# Patient Record
Sex: Female | Born: 1945 | Race: White | Hispanic: No | Marital: Married | Smoking: Current every day smoker
Health system: Southern US, Community
[De-identification: ages and names within clinical notes are randomized; demographics above are authoritative.]

## PROBLEM LIST (undated history)

## (undated) ENCOUNTER — Emergency Department (HOSPITAL_COMMUNITY): Discharge status: Absent without leave | Payer: Medicare Other

## (undated) DIAGNOSIS — F419 Anxiety disorder, unspecified: Secondary | ICD-10-CM

## (undated) DIAGNOSIS — M1990 Unspecified osteoarthritis, unspecified site: Secondary | ICD-10-CM

## (undated) DIAGNOSIS — I4891 Unspecified atrial fibrillation: Secondary | ICD-10-CM

## (undated) DIAGNOSIS — J189 Pneumonia, unspecified organism: Secondary | ICD-10-CM

## (undated) DIAGNOSIS — J449 Chronic obstructive pulmonary disease, unspecified: Secondary | ICD-10-CM

## (undated) DIAGNOSIS — F329 Major depressive disorder, single episode, unspecified: Secondary | ICD-10-CM

## (undated) DIAGNOSIS — R51 Headache: Secondary | ICD-10-CM

## (undated) DIAGNOSIS — R0602 Shortness of breath: Secondary | ICD-10-CM

## (undated) DIAGNOSIS — F1010 Alcohol abuse, uncomplicated: Secondary | ICD-10-CM

## (undated) DIAGNOSIS — F32A Depression, unspecified: Secondary | ICD-10-CM

## (undated) DIAGNOSIS — K219 Gastro-esophageal reflux disease without esophagitis: Secondary | ICD-10-CM

## (undated) HISTORY — PX: ABDOMINAL HYSTERECTOMY: SHX81

## (undated) HISTORY — PX: CHOLECYSTECTOMY: SHX55

## (undated) HISTORY — PX: OTHER SURGICAL HISTORY: SHX169

## (undated) HISTORY — PX: RECTOCELE REPAIR: SHX761

## (undated) HISTORY — PX: FRACTURE SURGERY: SHX138

---

## 1975-04-20 ENCOUNTER — Inpatient Hospital Stay (HOSPITAL_COMMUNITY): Payer: Self-pay

## 2012-10-18 ENCOUNTER — Inpatient Hospital Stay (HOSPITAL_COMMUNITY)
Admission: RE | Admit: 2012-10-18 | Discharge: 2012-10-23 | DRG: 193 | Disposition: A | Discharge status: Home Health | Payer: Medicare Other | Source: Other Acute Inpatient Hospital | Attending: Internal Medicine | Admitting: Internal Medicine

## 2012-10-18 DIAGNOSIS — I4891 Unspecified atrial fibrillation: Secondary | ICD-10-CM | POA: Diagnosis present

## 2012-10-18 DIAGNOSIS — J9620 Acute and chronic respiratory failure, unspecified whether with hypoxia or hypercapnia: Secondary | ICD-10-CM | POA: Diagnosis present

## 2012-10-18 DIAGNOSIS — I509 Heart failure, unspecified: Secondary | ICD-10-CM | POA: Diagnosis present

## 2012-10-18 DIAGNOSIS — Z9229 Personal history of other drug therapy: Secondary | ICD-10-CM

## 2012-10-18 DIAGNOSIS — I248 Other forms of acute ischemic heart disease: Secondary | ICD-10-CM | POA: Diagnosis present

## 2012-10-18 DIAGNOSIS — G9341 Metabolic encephalopathy: Secondary | ICD-10-CM | POA: Diagnosis present

## 2012-10-18 DIAGNOSIS — R918 Other nonspecific abnormal finding of lung field: Secondary | ICD-10-CM

## 2012-10-18 DIAGNOSIS — Z9981 Dependence on supplemental oxygen: Secondary | ICD-10-CM

## 2012-10-18 DIAGNOSIS — J189 Pneumonia, unspecified organism: Principal | ICD-10-CM | POA: Diagnosis present

## 2012-10-18 DIAGNOSIS — F10939 Alcohol use, unspecified with withdrawal, unspecified: Secondary | ICD-10-CM | POA: Diagnosis present

## 2012-10-18 DIAGNOSIS — N390 Urinary tract infection, site not specified: Secondary | ICD-10-CM | POA: Diagnosis present

## 2012-10-18 DIAGNOSIS — R222 Localized swelling, mass and lump, trunk: Secondary | ICD-10-CM | POA: Diagnosis present

## 2012-10-18 DIAGNOSIS — I5043 Acute on chronic combined systolic (congestive) and diastolic (congestive) heart failure: Secondary | ICD-10-CM | POA: Diagnosis present

## 2012-10-18 DIAGNOSIS — E876 Hypokalemia: Secondary | ICD-10-CM | POA: Diagnosis present

## 2012-10-18 DIAGNOSIS — E871 Hypo-osmolality and hyponatremia: Secondary | ICD-10-CM | POA: Diagnosis present

## 2012-10-18 DIAGNOSIS — D638 Anemia in other chronic diseases classified elsewhere: Secondary | ICD-10-CM | POA: Diagnosis present

## 2012-10-18 DIAGNOSIS — G9340 Encephalopathy, unspecified: Secondary | ICD-10-CM

## 2012-10-18 DIAGNOSIS — J449 Chronic obstructive pulmonary disease, unspecified: Secondary | ICD-10-CM

## 2012-10-18 DIAGNOSIS — F1010 Alcohol abuse, uncomplicated: Secondary | ICD-10-CM | POA: Diagnosis present

## 2012-10-18 DIAGNOSIS — I6529 Occlusion and stenosis of unspecified carotid artery: Secondary | ICD-10-CM | POA: Diagnosis present

## 2012-10-18 DIAGNOSIS — I6523 Occlusion and stenosis of bilateral carotid arteries: Secondary | ICD-10-CM

## 2012-10-18 DIAGNOSIS — F1020 Alcohol dependence, uncomplicated: Secondary | ICD-10-CM | POA: Diagnosis present

## 2012-10-18 DIAGNOSIS — R748 Abnormal levels of other serum enzymes: Secondary | ICD-10-CM | POA: Diagnosis present

## 2012-10-18 DIAGNOSIS — J441 Chronic obstructive pulmonary disease with (acute) exacerbation: Secondary | ICD-10-CM | POA: Diagnosis present

## 2012-10-18 DIAGNOSIS — F10239 Alcohol dependence with withdrawal, unspecified: Secondary | ICD-10-CM | POA: Diagnosis present

## 2012-10-18 DIAGNOSIS — J9601 Acute respiratory failure with hypoxia: Secondary | ICD-10-CM

## 2012-10-18 DIAGNOSIS — F17200 Nicotine dependence, unspecified, uncomplicated: Secondary | ICD-10-CM | POA: Diagnosis present

## 2012-10-18 DIAGNOSIS — H5316 Psychophysical visual disturbances: Secondary | ICD-10-CM | POA: Diagnosis present

## 2012-10-18 DIAGNOSIS — A498 Other bacterial infections of unspecified site: Secondary | ICD-10-CM | POA: Diagnosis present

## 2012-10-18 DIAGNOSIS — I2489 Other forms of acute ischemic heart disease: Secondary | ICD-10-CM | POA: Diagnosis present

## 2012-10-18 HISTORY — DX: Gastro-esophageal reflux disease without esophagitis: K21.9

## 2012-10-18 HISTORY — DX: Unspecified osteoarthritis, unspecified site: M19.90

## 2012-10-18 HISTORY — DX: Depression, unspecified: F32.A

## 2012-10-18 HISTORY — DX: Chronic obstructive pulmonary disease, unspecified: J44.9

## 2012-10-18 HISTORY — DX: Alcohol abuse, uncomplicated: F10.10

## 2012-10-18 HISTORY — DX: Unspecified atrial fibrillation: I48.91

## 2012-10-18 HISTORY — DX: Anxiety disorder, unspecified: F41.9

## 2012-10-18 HISTORY — DX: Headache: R51

## 2012-10-18 HISTORY — DX: Major depressive disorder, single episode, unspecified: F32.9

## 2012-10-18 HISTORY — DX: Shortness of breath: R06.02

## 2012-10-18 HISTORY — DX: Pneumonia, unspecified organism: J18.9

## 2012-10-18 LAB — CBC
HCT: 30.1 % — ABNORMAL LOW (ref 36.0–46.0)
MCH: 30.6 pg (ref 26.0–34.0)
MCV: 88.5 fL (ref 78.0–100.0)
Platelets: 294 10*3/uL (ref 150–400)
RBC: 3.4 MIL/uL — ABNORMAL LOW (ref 3.87–5.11)
WBC: 13.5 10*3/uL — ABNORMAL HIGH (ref 4.0–10.5)

## 2012-10-18 LAB — CREATININE, SERUM: GFR calc Af Amer: >90 mL/min (ref >90)

## 2012-10-18 MED ORDER — LORAZEPAM 1 MG PO TABS: 1.0000 mg | ORAL_TABLET | Freq: Four times a day (QID) | ORAL | Status: DC | PRN

## 2012-10-18 MED ORDER — SODIUM CHLORIDE 0.9 % IV SOLN
INTRAVENOUS | Status: AC
  Administered 2012-10-19: 01:00:00 via INTRAVENOUS

## 2012-10-18 MED ORDER — GUAIFENESIN ER 600 MG PO TB12
600.0000 mg | ORAL_TABLET | Freq: Two times a day (BID) | ORAL | Status: DC
  Administered 2012-10-19 (×2): 600 mg via ORAL
  Filled 2012-10-18 (×3): qty 1

## 2012-10-18 MED ORDER — FOLIC ACID 1 MG PO TABS
1.0000 mg | ORAL_TABLET | Freq: Every day | ORAL | Status: DC
  Administered 2012-10-19 – 2012-10-23 (×6): 1 mg via ORAL
  Filled 2012-10-18 (×6): qty 1

## 2012-10-18 MED ORDER — THIAMINE HCL 100 MG/ML IJ SOLN
100.0000 mg | Freq: Every day | INTRAMUSCULAR | Status: DC
  Filled 2012-10-18 (×6): qty 1

## 2012-10-18 MED ORDER — LEVOFLOXACIN IN D5W 750 MG/150ML IV SOLN
750.0000 mg | INTRAVENOUS | Status: DC
  Administered 2012-10-19: 01:00:00 750 mg via INTRAVENOUS
  Filled 2012-10-18 (×2): qty 150

## 2012-10-18 MED ORDER — ADULT MULTIVITAMIN W/MINERALS CH
1.0000 | ORAL_TABLET | Freq: Every day | ORAL | Status: DC
  Administered 2012-10-19 – 2012-10-23 (×5): 1 via ORAL
  Filled 2012-10-18 (×5): qty 1

## 2012-10-18 MED ORDER — DABIGATRAN ETEXILATE MESYLATE 150 MG PO CAPS
150.0000 mg | ORAL_CAPSULE | Freq: Two times a day (BID) | ORAL | Status: DC
  Administered 2012-10-19 (×3): 150 mg via ORAL
  Filled 2012-10-18 (×5): qty 1

## 2012-10-18 MED ORDER — SERTRALINE HCL 100 MG PO TABS
100.0000 mg | ORAL_TABLET | Freq: Every day | ORAL | Status: DC
  Administered 2012-10-19: 09:00:00 100 mg via ORAL
  Filled 2012-10-18: qty 1

## 2012-10-18 MED ORDER — VITAMIN B-1 100 MG PO TABS
100.0000 mg | ORAL_TABLET | Freq: Every day | ORAL | Status: DC
  Administered 2012-10-19 – 2012-10-23 (×6): 100 mg via ORAL
  Filled 2012-10-18 (×6): qty 1

## 2012-10-18 MED ORDER — METHYLPREDNISOLONE SODIUM SUCC 125 MG IJ SOLR
80.0000 mg | Freq: Two times a day (BID) | INTRAMUSCULAR | Status: DC
  Administered 2012-10-19 (×3): 80 mg via INTRAVENOUS
  Filled 2012-10-18 (×5): qty 1.28

## 2012-10-18 MED ORDER — ALBUTEROL SULFATE (5 MG/ML) 0.5% IN NEBU: 2.5000 mg | INHALATION_SOLUTION | RESPIRATORY_TRACT | Status: DC | PRN

## 2012-10-18 MED ORDER — LORAZEPAM 2 MG/ML IJ SOLN: 1.0000 mg | Freq: Four times a day (QID) | INTRAMUSCULAR | Status: DC | PRN

## 2012-10-18 NOTE — H&P (Signed)
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  Pt was hospitalized over 48 hours ago with ams, uti.  Was also dx with pna and new lung mass.  Thought to have gone through some etoh withdrawal.  No culture data with chart.  Could not find med rec sheet either.  Per records, pt has been on levaquin and urine cx grew out e coli, and possible blood cx with contaminate growth.  She has also been having some mild hyponatremia last Na level approx 127.  Cr function has been normal.  Pt denies any pain at this time.  Feels ok.  Has no complaints.  Says she is chronically on oxygen at home previous to admission.    Review of Systems:  Positive and negative as per HPI otherwise all other systems are negative  Past Medical History: As above   Medications: Prior to Admission medications   Medication Sig Start Date End Date Taking? Authorizing Provider  amiodarone (PACERONE) 200 MG tablet Take 200 mg by mouth 2 (two) times daily.   Yes Historical Provider, MD  dabigatran (PRADAXA) 150 MG CAPS capsule Take 150 mg by mouth every 12 (twelve) hours.   Yes Historical Provider, MD  diltiazem (TIAZAC) 240 MG 24 hr capsule Take 240 mg by mouth daily.   Yes Historical Provider, MD  HYDROcodone-acetaminophen (NORCO) 7.5-325 MG per tablet Take 1 tablet by mouth every 6 (six) hours as needed for pain.   Yes Historical Provider, MD  loratadine (CLARITIN) 10 MG tablet Take 10 mg by mouth daily.   Yes Historical Provider, MD  LORazepam (ATIVAN) 1 MG tablet Take 1 mg by mouth every 6 (six) hours as needed for anxiety.   Yes Historical Provider, MD  nicotine (NICODERM CQ - DOSED IN MG/24 HOURS) 21 mg/24hr patch Place 1 patch onto the skin daily.   Yes Historical Provider, MD  sertraline (ZOLOFT) 100 MG tablet Take 150 mg by mouth daily.   Yes Historical Provider, MD    Allergies:   Allergies   Allergen Reactions  . Other Anaphylaxis    Any "cillins"  . Penicillins Anaphylaxis    Social History: Lives at home with husband  Family History: None   Physical Exam: Filed Vitals:   10/18/12 2047  BP: 148/74  Pulse: 91  Temp: 97.6 F (36.4 C)  TempSrc: Oral  Resp: 18  Height: 5\' 6"  (1.676 m)  Weight: 54.8 kg (120 lb 13 oz)  SpO2: 98%   General appearance: alert, cooperative and no distress Head: Normocephalic, without obvious abnormality, atraumatic Eyes: negative Nose: Nares normal. Septum midline. Mucosa normal. No drainage or sinus tenderness. Neck: no JVD and supple, symmetrical, trachea midline Lungs: clear to auscultation bilaterally Heart: regular rate and rhythm, S1, S2 normal, no murmur, click, rub or gallop Abdomen: soft, non-tender; bowel sounds normal; no masses,  no organomegaly Extremities: extremities normal, atraumatic, no cyanosis or edema Pulses: 2+ and symmetric Skin: Skin color, texture, turgor normal. No rashes or lesions Neurologic: Grossly normal    Labs on Admission:  Reviewed. Mild low na level Normal cr Slightly elevated tsh Ct head neg Ct chest with lung mass   Radiological Exams on Admission: No results found.  Assessment/Plan  67 yo female with transfer from outside facility with dx uti, etoh withdrawal, pna, afib, new lung mass Principal Problem:   PNA (pneumonia) Active Problems:   ETOH abuse  UTI (urinary tract infection)   Afib   Lung mass   Alcohol withdrawal  Cont levaquin.  Repeat labs in am.  Place on ivf overnight.  Cont etoh ciwa protocol but should be out of window for withdrawal.  Clarify medications, have asked pharm to do this (including her hosp meds).  Nebs, steroids and oxygen for copd.  New lung mass needs w/u, did not see this was done at outside facililty per their records.  Ck free t4 level.  Tele.  Full code.  Keigan Girten A 10/18/2012, 10:38 PM

## 2012-10-18 NOTE — Plan of Care (Cosign Needed)
RN called- dtr of pt at bedside- requested pt have home meds og Neurontin and Mucinex reordered here- OK with Mucinex but since admitted with AMS will defer that med until pt can be re-examined in am by the rounding team.  Junious Silk, ANP

## 2012-10-19 ENCOUNTER — Inpatient Hospital Stay (HOSPITAL_COMMUNITY): Payer: Medicare Other

## 2012-10-19 ENCOUNTER — Encounter (HOSPITAL_COMMUNITY): Payer: Self-pay | Admitting: *Deleted

## 2012-10-19 DIAGNOSIS — G9340 Encephalopathy, unspecified: Secondary | ICD-10-CM

## 2012-10-19 DIAGNOSIS — I6529 Occlusion and stenosis of unspecified carotid artery: Secondary | ICD-10-CM

## 2012-10-19 DIAGNOSIS — J441 Chronic obstructive pulmonary disease with (acute) exacerbation: Secondary | ICD-10-CM

## 2012-10-19 DIAGNOSIS — R222 Localized swelling, mass and lump, trunk: Secondary | ICD-10-CM

## 2012-10-19 DIAGNOSIS — N390 Urinary tract infection, site not specified: Secondary | ICD-10-CM

## 2012-10-19 DIAGNOSIS — F10239 Alcohol dependence with withdrawal, unspecified: Secondary | ICD-10-CM

## 2012-10-19 DIAGNOSIS — I658 Occlusion and stenosis of other precerebral arteries: Secondary | ICD-10-CM

## 2012-10-19 DIAGNOSIS — F1010 Alcohol abuse, uncomplicated: Secondary | ICD-10-CM

## 2012-10-19 DIAGNOSIS — J189 Pneumonia, unspecified organism: Principal | ICD-10-CM

## 2012-10-19 DIAGNOSIS — J9601 Acute respiratory failure with hypoxia: Secondary | ICD-10-CM

## 2012-10-19 LAB — MRSA PCR SCREENING: MRSA by PCR: NEGATIVE

## 2012-10-19 LAB — BLOOD GAS, ARTERIAL
Acid-Base Excess: 3.6 mmol/L — ABNORMAL HIGH (ref 0.0–2.0)
Bicarbonate: 26.9 mEq/L — ABNORMAL HIGH (ref 20.0–24.0)
O2 Content: 5 L/min
O2 Saturation: 94.2 %
TCO2: 23.2 mmol/L (ref 0–100)
pCO2 arterial: 37.5 mmHg (ref 35.0–45.0)
pO2, Arterial: 73.3 mmHg — ABNORMAL LOW (ref 80.0–100.0)

## 2012-10-19 LAB — CBC WITH DIFFERENTIAL/PLATELET
Eosinophils Relative: 0 % (ref 0–5)
HCT: 29.3 % — ABNORMAL LOW (ref 36.0–46.0)
Hemoglobin: 10.2 g/dL — ABNORMAL LOW (ref 12.0–15.0)
Lymphocytes Relative: 3 % — ABNORMAL LOW (ref 12–46)
Lymphs Abs: 0.4 10*3/uL — ABNORMAL LOW (ref 0.7–4.0)
MCV: 89.3 fL (ref 78.0–100.0)
Monocytes Absolute: 0.4 10*3/uL (ref 0.1–1.0)
Monocytes Relative: 4 % (ref 3–12)
Neutro Abs: 11.2 10*3/uL — ABNORMAL HIGH (ref 1.7–7.7)
WBC: 12 10*3/uL — ABNORMAL HIGH (ref 4.0–10.5)

## 2012-10-19 LAB — BASIC METABOLIC PANEL
BUN: 11 mg/dL (ref 6–23)
CO2: 28 mEq/L (ref 19–32)
Calcium: 8.1 mg/dL — ABNORMAL LOW (ref 8.4–10.5)
Chloride: 100 mEq/L (ref 96–112)
Creatinine, Ser: 0.43 mg/dL — ABNORMAL LOW (ref 0.50–1.10)
Glucose, Bld: 158 mg/dL — ABNORMAL HIGH (ref 70–99)

## 2012-10-19 LAB — SEDIMENTATION RATE: Sed Rate: 70 mm/hr — ABNORMAL HIGH (ref 0–22)

## 2012-10-19 LAB — TROPONIN I: Troponin I: 0.86 ng/mL (ref <0.30)

## 2012-10-19 MED ORDER — NICOTINE 21 MG/24HR TD PT24
21.0000 mg | MEDICATED_PATCH | TRANSDERMAL | Status: DC
  Administered 2012-10-19 – 2012-10-23 (×5): 21 mg via TRANSDERMAL
  Filled 2012-10-19 (×5): qty 1

## 2012-10-19 MED ORDER — ALBUTEROL SULFATE (5 MG/ML) 0.5% IN NEBU
2.5000 mg | INHALATION_SOLUTION | Freq: Four times a day (QID) | RESPIRATORY_TRACT | Status: DC
  Administered 2012-10-19 (×2): 2.5 mg via RESPIRATORY_TRACT
  Filled 2012-10-19: qty 0.5

## 2012-10-19 MED ORDER — ENSURE COMPLETE PO LIQD
237.0000 mL | Freq: Two times a day (BID) | ORAL | Status: DC
  Administered 2012-10-19 – 2012-10-23 (×8): 237 mL via ORAL

## 2012-10-19 MED ORDER — LORAZEPAM 2 MG/ML IJ SOLN: 1.0000 mg | INTRAMUSCULAR | Status: DC | PRN

## 2012-10-19 MED ORDER — LORATADINE 10 MG PO TABS
10.0000 mg | ORAL_TABLET | Freq: Every day | ORAL | Status: DC
  Administered 2012-10-19: 13:00:00 10 mg via ORAL
  Filled 2012-10-19: qty 1

## 2012-10-19 MED ORDER — ALBUTEROL SULFATE (5 MG/ML) 0.5% IN NEBU
INHALATION_SOLUTION | RESPIRATORY_TRACT | Status: AC
  Administered 2012-10-19: 2.5 mg via RESPIRATORY_TRACT
  Filled 2012-10-19: qty 0.5

## 2012-10-19 MED ORDER — PNEUMOCOCCAL VAC POLYVALENT 25 MCG/0.5ML IJ INJ
0.5000 mL | INJECTION | INTRAMUSCULAR | Status: AC
  Administered 2012-10-20: 0.5 mL via INTRAMUSCULAR
  Filled 2012-10-19 (×2): qty 0.5

## 2012-10-19 MED ORDER — FUROSEMIDE 10 MG/ML IJ SOLN
40.0000 mg | Freq: Two times a day (BID) | INTRAMUSCULAR | Status: DC
  Administered 2012-10-19 – 2012-10-20 (×3): 40 mg via INTRAVENOUS
  Filled 2012-10-19 (×4): qty 4

## 2012-10-19 MED ORDER — BIOTENE DRY MOUTH MT LIQD
15.0000 mL | Freq: Two times a day (BID) | OROMUCOSAL | Status: DC
  Administered 2012-10-19 – 2012-10-23 (×9): 15 mL via OROMUCOSAL

## 2012-10-19 MED ORDER — IPRATROPIUM BROMIDE 0.02 % IN SOLN
0.5000 mg | RESPIRATORY_TRACT | Status: DC
  Administered 2012-10-19 – 2012-10-20 (×4): 0.5 mg via RESPIRATORY_TRACT
  Filled 2012-10-19 (×4): qty 2.5

## 2012-10-19 MED ORDER — ALBUTEROL SULFATE (5 MG/ML) 0.5% IN NEBU
2.5000 mg | INHALATION_SOLUTION | RESPIRATORY_TRACT | Status: DC
  Administered 2012-10-19 – 2012-10-20 (×4): 2.5 mg via RESPIRATORY_TRACT
  Filled 2012-10-19 (×4): qty 0.5

## 2012-10-19 MED ORDER — DEXMEDETOMIDINE HCL IN NACL 200 MCG/50ML IV SOLN
0.2000 ug/kg/h | INTRAVENOUS | Status: DC
  Administered 2012-10-19: 0.2 ug/kg/h via INTRAVENOUS
  Filled 2012-10-19: qty 50

## 2012-10-19 MED ORDER — SERTRALINE HCL 50 MG PO TABS
150.0000 mg | ORAL_TABLET | Freq: Every day | ORAL | Status: DC
  Administered 2012-10-19: 50 mg via ORAL
  Administered 2012-10-20 – 2012-10-23 (×4): 150 mg via ORAL
  Filled 2012-10-19 (×5): qty 1

## 2012-10-19 MED ORDER — HYDROCODONE-ACETAMINOPHEN 5-325 MG PO TABS
1.0000 | ORAL_TABLET | Freq: Once | ORAL | Status: AC
  Administered 2012-10-19: 09:00:00 1 via ORAL
  Filled 2012-10-19: qty 1

## 2012-10-19 MED ORDER — IPRATROPIUM BROMIDE 0.02 % IN SOLN
0.5000 mg | Freq: Four times a day (QID) | RESPIRATORY_TRACT | Status: DC
  Administered 2012-10-19 (×2): 0.5 mg via RESPIRATORY_TRACT
  Filled 2012-10-19: qty 2.5

## 2012-10-19 MED ORDER — CIPROFLOXACIN IN D5W 400 MG/200ML IV SOLN
400.0000 mg | Freq: Two times a day (BID) | INTRAVENOUS | Status: DC
  Administered 2012-10-19: 400 mg via INTRAVENOUS
  Filled 2012-10-19 (×2): qty 200

## 2012-10-19 MED ORDER — IPRATROPIUM BROMIDE 0.02 % IN SOLN
RESPIRATORY_TRACT | Status: AC
  Administered 2012-10-19: 0.5 mg via RESPIRATORY_TRACT
  Filled 2012-10-19: qty 2.5

## 2012-10-19 MED ORDER — TRAMADOL HCL 50 MG PO TABS
50.0000 mg | ORAL_TABLET | Freq: Four times a day (QID) | ORAL | Status: DC | PRN
  Administered 2012-10-19: 50 mg via ORAL
  Filled 2012-10-19: qty 1

## 2012-10-19 MED ORDER — INFLUENZA VAC SPLIT QUAD 0.5 ML IM SUSP
0.5000 mL | INTRAMUSCULAR | Status: AC
  Administered 2012-10-19: 09:00:00 0.5 mL via INTRAMUSCULAR
  Filled 2012-10-19: qty 0.5

## 2012-10-19 MED ORDER — AMIODARONE HCL 200 MG PO TABS
200.0000 mg | ORAL_TABLET | Freq: Two times a day (BID) | ORAL | Status: DC
  Administered 2012-10-19 – 2012-10-20 (×4): 200 mg via ORAL
  Filled 2012-10-19 (×6): qty 1

## 2012-10-19 MED ORDER — INFLUENZA VAC SPLIT QUAD 0.5 ML IM SUSP: 0.5000 mL | INTRAMUSCULAR | Status: DC

## 2012-10-19 MED ORDER — DILTIAZEM HCL ER BEADS 240 MG PO CP24
240.0000 mg | ORAL_CAPSULE | Freq: Every day | ORAL | Status: DC
  Administered 2012-10-19 – 2012-10-23 (×5): 240 mg via ORAL
  Filled 2012-10-19 (×5): qty 1

## 2012-10-19 MED ORDER — HYDROCODONE-ACETAMINOPHEN 7.5-325 MG PO TABS: 1.0000 | ORAL_TABLET | Freq: Four times a day (QID) | ORAL | Status: DC | PRN

## 2012-10-19 MED ORDER — DEXMEDETOMIDINE HCL IN NACL 200 MCG/50ML IV SOLN
0.2000 ug/kg/h | INTRAVENOUS | Status: DC
  Administered 2012-10-20: 0.3 ug/kg/h via INTRAVENOUS
  Filled 2012-10-19 (×2): qty 50

## 2012-10-19 NOTE — Progress Notes (Signed)
Pt received to room 1425 from [facility], [place] via ambulance. Pt on 5 liters of O2. Somewhat short of breath. Assisted to use bedpan and voided 300cc's.  Bed alarm on. Will notify md on call for orders.

## 2012-10-19 NOTE — Progress Notes (Signed)
PT Cancellation Note  Patient Details Name: Alexis Cuevas MRN: [id number] DOB: [birth year]   Cancelled Treatment:    Reason Eval/Treat Not Completed: Medical issues which prohibited therapy (attempted earleier, in pain. will return 9/13 AM)   Rada Hay 10/19/2012, 3:34 PM

## 2012-10-19 NOTE — Progress Notes (Signed)
INITIAL NUTRITION ASSESSMENT  DOCUMENTATION CODES Per approved criteria  -Not Applicable   INTERVENTION: 1. Ensure Complete po BID, each supplement provides 350 kcal and 13 grams of protein. 2. Magic cup BID between meals, each supplement provides 290 kcal and 9 grams of protein.  NUTRITION DIAGNOSIS: Inadequate oral intake related to pneumonia and lung mass as evidenced by reported intake less than estimated needs.   Goal: Pt to meet >/= 90% of their estimated nutrition needs   Monitor:  Weight, height, po intake  Reason for Assessment: MST  67 y.o. female  Admitting Dx: Acute encephalopathy  ASSESSMENT: Pt transferred from outside facility. 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 Per daughter, who is an employee at [employer], pt may have lost weight (about 7 lbs) recently. Daughter reports that pt's usual body weight is about 113 lbs. Pt's current body weight is 120 lbs. Per RN, pt has only had about 20-25% meal completion and that her diet has been changed to regular from heart healthy to try and increase po intake.   Height: Ht Readings from Last 1 Encounters:  10/18/12 5\' 6"  (1.676 m)    Weight: Wt Readings from Last 1 Encounters:  10/18/12 120 lb 13 oz (54.8 kg)    Ideal Body Weight: 59.3 kg  % Ideal Body Weight: 92%   Wt Readings from Last 10 Encounters:  10/18/12 120 lb 13 oz (54.8 kg)    Usual Body Weight: 113 lbs  % Usual Body Weight: 106%  BMI:  Body mass index is 19.51 kg/(m^2).  Estimated Nutritional Needs: Kcal: 1400-1550 Protein: 55-65 g Fluid: >1.6 L  Skin: WNL  Diet Order: General  EDUCATION NEEDS: -Education not appropriate at this time   Intake/Output Summary (Last 24 hours) at 10/19/12 1324 Last data filed at 10/19/12 1100  Gross per 24 hour  Intake    120 ml  Output    375 ml  Net   -255 ml    Last BM: none recorded   Labs:   Recent Labs Lab 10/18/12 2217 10/19/12 0400  NA  --  135   K  --  3.8  CL  --  100  CO2  --  28  BUN  --  11  CREATININE 0.44* 0.43*  CALCIUM  --  8.1*  GLUCOSE  --  158*    CBG (last 3)  No results found for this basename: GLUCAP,  in the last 72 hours  Scheduled Meds: . albuterol  2.5 mg Nebulization Q6H  . amiodarone  200 mg Oral BID  . antiseptic oral rinse  15 mL Mouth Rinse BID  . dabigatran  150 mg Oral Q12H  . diltiazem  240 mg Oral Daily  . folic acid  1 mg Oral Daily  . guaiFENesin  600 mg Oral BID  . ipratropium  0.5 mg Nebulization Q6H  . levofloxacin (LEVAQUIN) IV  750 mg Intravenous Q24H  . loratadine  10 mg Oral Daily  . methylPREDNISolone (SOLU-MEDROL) injection  80 mg Intravenous Q12H  . multivitamin with minerals  1 tablet Oral Daily  . nicotine  21 mg Transdermal Q24H  . [START ON 10/20/2012] pneumococcal 23 valent vaccine  0.5 mL Intramuscular Tomorrow-1000  . sertraline  150 mg Oral Daily  . thiamine  100 mg Oral Daily   Or  . thiamine  100 mg Intravenous Daily    Continuous Infusions:   Past Medical History  Diagnosis Date  . COPD (chronic obstructive  pulmonary disease)   . Depression   . Anxiety   . Shortness of breath   . Pneumonia   . Arthritis   . GERD (gastroesophageal reflux disease)   . Headache(784.0)   . Afib   . ETOH abuse     Past Surgical History  Procedure Laterality Date  . Abdominal hysterectomy    . Cholecystectomy    . Rectocele repair    . Jaw surgery with pins    . Fracture surgery      right arm    Ebbie Latus RD, LDN

## 2012-10-19 NOTE — Progress Notes (Signed)
[facility] PROGRESS NOTE  Alexis Cuevas ZOX:[id number] DOB: [birth year] DOA: 10/18/2012 PCP: No primary provider on file.  Assessment/Plan: Acute Encephalopathy -Presumed metabolic from PNA/UTI, altho questionable if she may have brain mets (unable to do MRI as she has metal in her jaw).  -CT Head from outside hospital was read as negative. -Seems to be improving: she is oriented, but still has visual hallucinations. Today she kept seeing "tiny people" in her room, she was also making movements with her hands and when I asked her what she was doing she said "I am planting flowers".  UTI -Reported cultures from outside hospital with pansensitive E coli. -Continue levaquin for 5 days.  PNA -Seen on CT chest at outside facility. -Continue levaquin. -No reason to believe this is aspiration PNA.  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 -Reported to be in the right hilum. -She has multiple pulmonary nodules. -Highly suspicious for malignancy in this smoker. -Have discussed with Pulmonary, who will see in consult for possible biopsy by bronchoscopy. -Patient and family interested in pursuing diagnosis and treatment.  COPD with Acute Exacerbation -She is definitely "tight" on exam today. -Continue IV steroids, will scheduled nebs as well as PRN. -Is on levaquin. -She is chronically oxygen dependant.   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 -Decision was made to treat her medically with Pradaxa. -Pradaxa may need to be held in anticipation of potential bronch with biopsy (pulmonary please advise!). Already received her am dose.  ETOH Abuse -Continue thiamine. -Do not believe she is at risk for withdrawals at this point as she has been hospitalized for 5 days now.  Tobacco Abuse -Nicotine patch. -Counseled on cessation.  DVT prophylaxis -On pradaxa.  Code Status: Full code Family Communication: Daughter Inetta Fermo at bedside, updated on plan of care.   Disposition Plan: Home when medically stable.   Consultants:  Pulmonary   Antibiotics:  Levaquin   Subjective: "I am having a rough time today with my breathing".  Objective: Filed Vitals:   10/18/12 2047 10/19/12 0200 10/19/12 0422  BP: 148/74  142/87  Pulse: 91 92 87  Temp: 97.6 F (36.4 C)  97.6 F (36.4 C)  TempSrc: Oral  Oral  Resp: 18  18  Height: 5\' 6"  (1.676 m)    Weight: 54.8 kg (120 lb 13 oz)    SpO2: 98%  95%    Intake/Output Summary (Last 24 hours) at 10/19/12 1020 Last data filed at 10/19/12 0838  Gross per 24 hour  Intake      0 ml  Output    375 ml  Net   -375 ml   Filed Weights   10/18/12 2047  Weight: 54.8 kg (120 lb 13 oz)    Exam:   General:  AA Ox3, but still with some visual hallucinations.  Cardiovascular: RRR, no M/R/G  Respiratory: Diffuse wheezing and ronchi, fair air movement.  Abdomen: S/NT/ND/+BS  Extremities: no C/C/E   Neurologic:  Non-focal  Data Reviewed: Basic Metabolic Panel:  Recent Labs Lab 10/18/12 2217 10/19/12 0400  NA  --  135  K  --  3.8  CL  --  100  CO2  --  28  GLUCOSE  --  158*  BUN  --  11  CREATININE 0.44* 0.43*  CALCIUM  --  8.1*   Liver Function Tests: No results found for this basename: AST, ALT, ALKPHOS, BILITOT, PROT, ALBUMIN,  in the last 168 hours No results found for this basename: LIPASE, AMYLASE,  in  the last 168 hours No results found for this basename: AMMONIA,  in the last 168 hours CBC:  Recent Labs Lab 10/18/12 2217 10/19/12 0400  WBC 13.5* 12.0*  NEUTROABS  --  11.2*  HGB 10.4* 10.2*  HCT 30.1* 29.3*  MCV 88.5 89.3  PLT 294 299   Cardiac Enzymes: No results found for this basename: CKTOTAL, CKMB, CKMBINDEX, TROPONINI,  in the last 168 hours BNP (last 3 results) No results found for this basename: PROBNP,  in the last 8760 hours CBG: No results found for this basename: GLUCAP,  in the last 168 hours  No results found for this or any previous visit (from the  past 240 hour(s)).   Studies: No results found.  Scheduled Meds: . albuterol  2.5 mg Nebulization Q6H  . amiodarone  200 mg Oral BID  . antiseptic oral rinse  15 mL Mouth Rinse BID  . dabigatran  150 mg Oral Q12H  . diltiazem  240 mg Oral Daily  . folic acid  1 mg Oral Daily  . guaiFENesin  600 mg Oral BID  . HYDROcodone-acetaminophen  1 tablet Oral Once  . ipratropium  0.5 mg Nebulization Q6H  . levofloxacin (LEVAQUIN) IV  750 mg Intravenous Q24H  . loratadine  10 mg Oral Daily  . methylPREDNISolone (SOLU-MEDROL) injection  80 mg Intravenous Q12H  . multivitamin with minerals  1 tablet Oral Daily  . nicotine  21 mg Transdermal Q24H  . sertraline  150 mg Oral Daily  . thiamine  100 mg Oral Daily   Or  . thiamine  100 mg Intravenous Daily   Continuous Infusions:   Principal Problem:   Acute encephalopathy Active Problems:   ETOH abuse   UTI (urinary tract infection)   PNA (pneumonia)   Lung mass   Alcohol withdrawal   Carotid stenosis   COPD with acute exacerbation    Time spent: 55 minutes    HERNANDEZ ACOSTA,ESTELA  [facility] Pager [phone]  If 7PM-7AM, please contact night-coverage at [email or web address], password [facility] 10/19/2012, 10:20 AM  LOS: 1 day

## 2012-10-19 NOTE — Progress Notes (Signed)
ANTIBIOTIC CONSULT NOTE - INITIAL  Pharmacy Consult for Cipro Indication: UTI, GNR sepsis   Allergies  Allergen Reactions  . Other Anaphylaxis    Any "cillins"  . Penicillins Anaphylaxis    Patient Measurements: Height: 5\' 7"  (170.2 cm) Weight: 111 lb 15.9 oz (50.8 kg) IBW/kg (Calculated) : 61.6 Adjusted Body Weight:   Vital Signs: Temp: 97.6 F (36.4 C) (09/12 2000) Temp src: Oral (09/12 2000) BP: 147/85 mmHg (09/12 2000) Pulse Rate: 95 (09/12 2000) Intake/Output from previous day:   Intake/Output from this shift:    Labs:  Recent Labs  10/18/12 2217 10/19/12 0400  WBC 13.5* 12.0*  HGB 10.4* 10.2*  PLT 294 299  CREATININE 0.44* 0.43*   Estimated Creatinine Clearance: 53.2 ml/min (by C-G formula based on Cr of 0.43). No results found for this basename: VANCOTROUGH, Leodis Binet, VANCORANDOM, GENTTROUGH, GENTPEAK, GENTRANDOM, TOBRATROUGH, TOBRAPEAK, TOBRARND, AMIKACINPEAK, AMIKACINTROU, AMIKACIN,  in the last 72 hours   Microbiology: Recent Results (from the past 720 hour(s))  MRSA PCR SCREENING     Status: None   Collection Time    10/19/12  5:53 PM      Result Value Range Status   MRSA by PCR NEGATIVE  NEGATIVE Final   Comment:            The GeneXpert MRSA Assay (FDA     approved for NASAL specimens     only), is one component of a     comprehensive MRSA colonization     surveillance program. It is not     intended to diagnose MRSA     infection nor to guide or     monitor treatment for     MRSA infections.    Medical History: Past Medical History  Diagnosis Date  . COPD (chronic obstructive pulmonary disease)   . Depression   . Anxiety   . Shortness of breath   . Pneumonia   . Arthritis   . GERD (gastroesophageal reflux disease)   . Headache(784.0)   . Afib   . ETOH abuse     Medications:  Anti-infectives   Start     Dose/Rate Route Frequency Ordered Stop   10/19/12 2215  ciprofloxacin (CIPRO) IVPB 400 mg     400 mg 200 mL/hr over 60  Minutes Intravenous 2 times daily 10/19/12 2211     10/18/12 2300  levofloxacin (LEVAQUIN) IVPB 750 mg  Status:  Discontinued     750 mg 100 mL/hr over 90 Minutes Intravenous Every 24 hours 10/18/12 2154 10/19/12 1628     Assessment: Patient on levofloxacin for UTI/PNA was completed.  MD wishes to start cipro for UTI, GNR sepsis.   Goal of Therapy:  Cipro dosed based on patient weight and renal function   Plan:  Follow up culture results Cipro 400mg  iv q12hr  Aleene Davidson Crowford 10/19/2012,10:15 PM

## 2012-10-19 NOTE — Progress Notes (Signed)
Daughter at bedside, pt attempting to get oob. AC notified and sitter requested for safety. Daughter does not plan on spending the night. Will cont to monitor.

## 2012-10-19 NOTE — Progress Notes (Signed)
CRITICAL VALUE ALERT  Critical value received:  Toponin 0.86  Date of notification: 10/19/12  Time of notification:  1900  Critical value read back:  Nurse who received alert:  P. Shelva Majestic RN  MD notified (1st page): Ardyth Harps  Time of first page:  1900  MD notified (2nd page):  Time of second page:  Responding MD:   Time MD responded:

## 2012-10-19 NOTE — Consult Note (Signed)
PULMONARY  / CRITICAL CARE MEDICINE  Name: Alexis Cuevas MRN: [id number] DOB: Apr 24, 1945    ADMISSION DATE:  10/18/2012 CONSULTATION DATE:  10/19/2012  REFERRING MD :  Peggye Pitt, MD PRIMARY SERVICE:  PCCM  CHIEF COMPLAINT:  Cough, dyspnea, altered mental status  BRIEF PATIENT DESCRIPTION: 67 yo female transferred from [facility] on 9/11 via ambulance per pt family request after being treated for acute delirium, possible PNA and question of RUL lung mass.   SIGNIFICANT EVENTS / STUDIES:  Chest X-ray @ [facility] 9/9 >>> Right sided pleural effusion CT Chest @ [facility] 9/10 >>> PCCM MD Staff note interpretion: Emphysema, Bilateral pleural effusion, RUL 2.7cm obolong area of consolidated mass like appearance in the midst of bilateral CRAZY pavement pattern alveolar filling Bilateral Upper lobes and lingulat  LINES / TUBES: right forearm IV placed at [facility] 9/10 >>>  CULTURES: Urine culture @ [facility] 9/8>>>Gram negative rods  ANTIBIOTICS: 9/7 IV Levaquin @ [facility]>>> 9/9 IV Clindamycin @ [facility] >>> 9/11  HISTORY OF PRESENT ILLNESS:  67 yo female with Hx bilateral carotid stenosis, COPD, atrial fibrillation, GERD, dysphagia, ETOH abuse, and encephalopathy is transferred to [facility] on 9/11 from [facility] per pt family request with c/o progressive worsening of chronic cough and dyspnea since 9/7. Productive cough with greenish sputum since 9/7.   Admitted to [facility] 9/7 for altered mental status with hallucinations. Hx alcohol abuse with last ETOH consumption 9/6. Was treated with IV Levaquin for UTI and was put on ETOH withdrawal protocol 9/7. On 9/9 pt presented with hallucinations, decreased breath sounds, and crackles. A chest x ray revealed right sided pleural effusion and started IV Clindamycin on 9/9. On 9/10, pt  developed SOB and O2 sat of 80 at RA. CT scan, 9/10, revealed questionable mass on right lung. Pt requested transfer to [facility] for further evaluation of lung mass.  Arrived at [facility] 9/11 with c/o cough, dyspnea, and intermittent delirium.   Denies any fever, chills, n/v, hemoptysis or chest pain.   Daughter noticed decline in pt's memory since March 2014.  Alcohol consumption consists of 80oz beers per day, 6 cigarettes per day since age 78. 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   PAST MEDICAL HISTORY :  Past Medical History  Diagnosis Date  . COPD (chronic obstructive pulmonary disease)   . Depression   . Anxiety   . Shortness of breath   . Pneumonia   . Arthritis   . GERD (gastroesophageal reflux disease)   . Headache(784.0)   . Afib   . ETOH abuse    Past Surgical History  Procedure Laterality Date  . Abdominal hysterectomy    . Cholecystectomy    . Rectocele repair    . Jaw surgery with pins    . Fracture surgery      right arm   Prior to Admission medications   Medication Sig Start Date End Date Taking? Authorizing Provider  amiodarone (PACERONE) 200 MG tablet Take 200 mg by mouth 2 (two) times daily.   Yes Historical Provider, MD  dabigatran (PRADAXA) 150 MG CAPS capsule Take 150 mg by mouth every 12 (twelve) hours.   Yes Historical Provider, MD  diltiazem (TIAZAC) 240 MG 24 hr capsule Take 240 mg by mouth daily.   Yes Historical Provider, MD  HYDROcodone-acetaminophen (NORCO) 7.5-325 MG per tablet Take 1 tablet by mouth every 6 (six)  hours as needed for pain.   Yes Historical Provider, MD  loratadine (CLARITIN) 10 MG tablet Take 10 mg by mouth daily.   Yes Historical Provider, MD  LORazepam (ATIVAN) 1 MG tablet Take 1 mg by mouth every 6 (six) hours as needed for anxiety.   Yes Historical Provider, MD  nicotine (NICODERM CQ - DOSED IN MG/24 HOURS) 21 mg/24hr patch Place 1 patch onto the skin daily.   Yes Historical Provider, MD  sertraline  (ZOLOFT) 100 MG tablet Take 150 mg by mouth daily.   Yes Historical Provider, MD   Allergies  Allergen Reactions  . Other Anaphylaxis    Any "cillins"  . Penicillins Anaphylaxis    FAMILY HISTORY:  History reviewed. No pertinent family history. SOCIAL HISTORY:  reports that she has been smoking Cigarettes.  She has a 120 pack-year smoking history. She does not have any smokeless tobacco history on file. She reports that she drinks about 12.6 ounces of alcohol per week. Her drug history is not on file.  REVIEW OF SYSTEMS:   Constitutional:  Negative for fever, chills, malaise/fatigue and diaphoresis.  Positive for 11 pound weight loss in last month.  HENT:  Negative for hearing loss, ear pain, nosebleeds, congestion, neck pain, tinnitus and ear discharge.   Positive for dysphagia with Hx GERD  Eyes:  Negative for blurred vision, double vision, photophobia, pain, discharge and redness.   Respiratory:  Negative for hemoptysis Positive for chronic cough w/ hx COPD, green sputum, dyspnea  Cardiovascular:  Negative for chest pain, palpitations, orthopnea, claudication, leg swelling and PND.   Gastrointestinal:  Negative for nausea, vomiting, abdominal pain, diarrhea, blood in stool and melena.  Positive for heart burn and chronic constipation.  Genitourinary:  Negative for dysuria, urgency, frequency, hematuria and flank pain.   Musculoskeletal:  Positive for myalgias, back pain, joint pain and falls.   Skin:  Negative for itching and rash.   Neurological:  Negative for tingling, tremors, focal weakness, weakness. Positive for bilateral occipital headache  Endo/Heme/Allergies:  Negative for environmental allergies and polydipsia. Positive for bruise/bleed easily, on Pradaxa.   SUBJECTIVE:   VITAL SIGNS: Temp:  [96.9 F (36.1 C)-97.9 F (36.6 C)] 97.6 F (36.4 C) (09/12 2000) Pulse Rate:  [87-102] 95 (09/12 2000) Resp:  [16-28] 23 (09/12 2000) BP:  (133-168)/(75-104) 147/85 mmHg (09/12 2000) SpO2:  [91 %-96 %] 95 % (09/12 2000) Weight:  [50.8 kg (111 lb 15.9 oz)] 50.8 kg (111 lb 15.9 oz) (09/12 1800)  PHYSICAL EXAMINATION: General:  Chronically ill appearing, on nasal O2 canula with labored respirations Neuro:  A & O x3 HEENT:  Atraumatic Neck:  Negative lymphadenopathy Cardiovascular:  SR, no m/r/g Lungs:  Bilateral inspiratory/expiratory wheezes and rhonci, basilar crackles left > right Abdomen:  Soft, NT/ND, +BS Musculoskeletal:  4+ strength bilateral UE and LE Skin:  Warm, negative pedal edema  PULMONARY  Recent Labs Lab 10/19/12 1640  PHART 7.469*  PCO2ART 37.5  PO2ART 73.3*  HCO3 26.9*  TCO2 23.2  O2SAT 94.2    CBC  Recent Labs Lab 10/18/12 2217 10/19/12 0400  HGB 10.4* 10.2*  HCT 30.1* 29.3*  WBC 13.5* 12.0*  PLT 294 299    COAGULATION No results found for this basename: INR,  in the last 168 hours  CARDIAC    Recent Labs Lab 10/19/12 1740  TROPONINI 0.86*    Recent Labs Lab 10/19/12 1741  PROBNP 11944.0*     CHEMISTRY  Recent Labs Lab 10/18/12 2217 10/19/12 0400  NA  --  135  K  --  3.8  CL  --  100  CO2  --  28  GLUCOSE  --  158*  BUN  --  11  CREATININE 0.44* 0.43*  CALCIUM  --  8.1*   Estimated Creatinine Clearance: 53.2 ml/min (by C-G formula based on Cr of 0.43).   LIVER No results found for this basename: AST, ALT, ALKPHOS, BILITOT, PROT, ALBUMIN, INR,  in the last 168 hours   INFECTIOUS No results found for this basename: LATICACIDVEN, PROCALCITON,  in the last 168 hours   ENDOCRINE CBG (last 3)  No results found for this basename: GLUCAP,  in the last 72 hours       IMAGING x48h  Dg Chest Port 1 View  10/19/2012   *RADIOLOGY REPORT*  Clinical Data: Cough and congestion.   Smoker for 60 years.  PORTABLE CHEST - 1 VIEW  Comparison: None.  Findings: Diffuse increased lung markings.  Some of findings may be chronic in origin but difficult to assess  without comparison. There may be superimposed pulmonary edema or possibly infectious infiltrate in the proper clinical setting.  Slight patchiness of lung changes limit detection of underlying mass.  Stability and/or clearing will need to be confirmed on follow-up.  Heart size within normal limits.  Calcified slightly tortuous aorta.  No gross pneumothorax.  Blunting right costophrenic angle may represent presence of small effusion.  IMPRESSION: Diffuse increased lung markings may represent pulmonary edema superimposed on chronic changes.  Follow up recommended as noted above.   Original Report Authenticated By: Lacy Duverney, M.D.     ASSESSMENT / PLAN:  PULMONARY A: Hx of COPD with active smoking: severe emphysema on CT chest Likely and Possible RUL Lung mass CT chest 10/17/12 Bialteral UL  pulmonary infiltrates : ? Cause. ?C HF, >viral pneumonia  Vs AMio Lung tox Blateral Pulmonary effusion : ? 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  P:  Continue Pradaxa, Diltiazem, Amiodarone  Obtain Echocardiogram to sourt out type of chf if any - Labs: Troponin, BNP - Diurese  RENAL A:  No acute problems P:     GASTROINTESTINAL A: No acute problems P:     HEMATOLOGIC A:  Anemia of chronic disease P:  Trend CBC Check Mg Levels Administer Thiamine - PRBC for hgb </= 6.9gm%    - exceptions are   -  if ACS susepcted/confirmed then transfuse for hgb </= 8.0gm%,  or    -  If septic shock first 24h and scvo2 < 70% then transfuse for hgb </= 9.0gm%   - active bleeding with hemodynamic instability, then transfuse regardless of hemoglobin value   At at all times try to transfuse 1 unit prbc as possible  with exception of active hemorrhage    INFECTIOUS A:   Hx Recent UTI at admit in outside hospital. GNR in blood culture outside hospital on IV Levaquin 9/7>>> P:   STAFF NOTE: D/c IV Levaquin due to GNR in urine. STart IV ciprp (will keep an eye on conusion with cipro) F/u UA   ENDOCRINE A:  No acute problems  P:    NEUROLOGIC A:  Delirium P:   D/c mucinex, narcotics, claritin Depending on course, consider d/c Zoloft Consider UTI, Pneumonia as etiology  -Trend CBC, ABG Consider ETOH withdrawal  - d/c benzo guided ETOH withdrawal protocol  - d/c Tramadol  - Start Precedex gtt, Ativan PRN   - BMP   TODAY'S SUMMARY:  Pt admitted for Delirium 9/7 at [facility] 9/7 and was tx'd with IV Levaquin for a UTI. Pt initially improved, then continued to have hallucinations 9/9. Chest Xray, 9/9, showed right sided pleural effusion possibly from aspiration PNA, was started on IV Clindamycin. Increasing symptoms of dyspnea and cough prompted CT of chest, 9/10, with questionable right lung mass. Pt's family requested transfer to [facility] for further examination of mass and altered mental status 9/11.  Pt has a Hx of alcohol abuse, 80 oz beer daily. Last ETOH consumption 9/6. It is possible that her symptoms may be a result of ETOH withdrawal or ICU delirium. We will start her on Precedex and Ativan PRN.   It is unclear whether the mass seen on CT is an actual mass or pulmonary infiltrates. We will admit her to [facility] ICU, get her pulmonary symptoms under control with COPD exacerbation protocol, and r/o possible cardio etiology.      Cephus Shelling PA sstudent [facility] Ranell Patrick NP,  Pulmonary and Critical Care Medicine [facility] Pager: [phone]  10/19/2012, 3:12 PM   STAFF NOTE: I, Dr Lavinia Sharps have personally reviewed patient's available data, including medical history, events of note, physical examination and test results as part of my  evaluation. I have discussed with resident/NP and other care providers such as pharmacist, RN and RRT.  In addition,  I personally evaluated patient and elicited key findings of  ongoin acute delirium - ddx is meds, etoh withdrawal, hospital related persistent delirum due to sepsis/UTI. Will start precedex;  B) Acute resp on chronic resp failure: I am favring CHF more oer amio toxicity or infection. Will diurese and get echo. C) UTI -change cipro to levaquin; d) Possible RUL mass - cannot be evaluatd now. FU as opd. I updated dtr at bedside. Move to SDU.  Rest per NP/medical resident whose note is outlined above and that I agree with  The patient is critically ill with multiple organ systems failure and requires high complexity decision making for assessment and support, frequent evaluation and titration of therapies, application of advanced monitoring technologies and extensive interpretation of multiple databases.   Critical Care Time devoted to patient care services described in this note is  35  Minutes independnet of NP/PA time.  Dr. Kalman Shan, M.D., [facility].C.P Pulmonary and Critical Care Medicine Staff Physician [facility] Pulmonary and Critical Care Pager: [phone], If no answer or between  15:00h - 7:00h: call [phone]  10/19/2012 10:02 PM

## 2012-10-20 DIAGNOSIS — J9600 Acute respiratory failure, unspecified whether with hypoxia or hypercapnia: Secondary | ICD-10-CM

## 2012-10-20 DIAGNOSIS — I059 Rheumatic mitral valve disease, unspecified: Secondary | ICD-10-CM

## 2012-10-20 LAB — BASIC METABOLIC PANEL
BUN: 11 mg/dL (ref 6–23)
BUN: 17 mg/dL (ref 6–23)
Chloride: 90 mEq/L — ABNORMAL LOW (ref 96–112)
GFR calc Af Amer: >90 mL/min (ref >90)
GFR calc non Af Amer: >90 mL/min (ref >90)
GFR calc non Af Amer: >90 mL/min (ref >90)
Glucose, Bld: 270 mg/dL — ABNORMAL HIGH (ref 70–99)
Potassium: 2.8 mEq/L — ABNORMAL LOW (ref 3.5–5.1)
Potassium: 3.9 mEq/L (ref 3.5–5.1)
Sodium: 131 mEq/L — ABNORMAL LOW (ref 135–145)

## 2012-10-20 LAB — PRO B NATRIURETIC PEPTIDE: Pro B Natriuretic peptide (BNP): 23489 pg/mL — ABNORMAL HIGH (ref 0–125)

## 2012-10-20 LAB — CBC WITH DIFFERENTIAL/PLATELET
Basophils Absolute: 0 10*3/uL (ref 0.0–0.1)
Basophils Relative: 0 % (ref 0–1)
Eosinophils Relative: 0 % (ref 0–5)
HCT: 34.2 % — ABNORMAL LOW (ref 36.0–46.0)
Lymphocytes Relative: 4 % — ABNORMAL LOW (ref 12–46)
MCHC: 34.2 g/dL (ref 30.0–36.0)
Monocytes Absolute: 0.5 10*3/uL (ref 0.1–1.0)
Neutro Abs: 12.4 10*3/uL — ABNORMAL HIGH (ref 1.7–7.7)
Platelets: 327 10*3/uL (ref 150–400)
RDW: 15.1 % (ref 11.5–15.5)
WBC: 13.5 10*3/uL — ABNORMAL HIGH (ref 4.0–10.5)

## 2012-10-20 LAB — MAGNESIUM: Magnesium: 1.7 mg/dL (ref 1.5–2.5)

## 2012-10-20 LAB — TROPONIN I: Troponin I: 1.57 ng/mL (ref <0.30)

## 2012-10-20 LAB — LACTIC ACID, PLASMA: Lactic Acid, Venous: 1.3 mmol/L (ref 0.5–2.2)

## 2012-10-20 LAB — PROCALCITONIN: Procalcitonin: <0.1 ng/mL

## 2012-10-20 MED ORDER — IPRATROPIUM BROMIDE 0.02 % IN SOLN
0.5000 mg | Freq: Four times a day (QID) | RESPIRATORY_TRACT | Status: DC
  Administered 2012-10-20 – 2012-10-22 (×7): 0.5 mg via RESPIRATORY_TRACT
  Filled 2012-10-20 (×7): qty 2.5

## 2012-10-20 MED ORDER — HEPARIN (PORCINE) IN NACL 100-0.45 UNIT/ML-% IJ SOLN
700.0000 [IU]/h | INTRAMUSCULAR | Status: DC
  Administered 2012-10-20: 700 [IU]/h via INTRAVENOUS
  Filled 2012-10-20: qty 250

## 2012-10-20 MED ORDER — POTASSIUM CHLORIDE CRYS ER 20 MEQ PO TBCR
40.0000 meq | EXTENDED_RELEASE_TABLET | ORAL | Status: AC
  Administered 2012-10-20 (×2): 40 meq via ORAL
  Filled 2012-10-20 (×2): qty 2

## 2012-10-20 MED ORDER — HEPARIN (PORCINE) IN NACL 100-0.45 UNIT/ML-% IJ SOLN
700.0000 [IU]/h | INTRAMUSCULAR | Status: DC
  Filled 2012-10-20: qty 250

## 2012-10-20 MED ORDER — POTASSIUM CHLORIDE 10 MEQ/100ML IV SOLN: 10.0000 meq | INTRAVENOUS | Status: DC

## 2012-10-20 MED ORDER — MAGNESIUM SULFATE 50 % IJ SOLN: 2.0000 g | Freq: Once | INTRAVENOUS | Status: DC

## 2012-10-20 MED ORDER — CIPROFLOXACIN HCL 500 MG PO TABS
500.0000 mg | ORAL_TABLET | Freq: Two times a day (BID) | ORAL | Status: DC
  Administered 2012-10-20 – 2012-10-21 (×2): 500 mg via ORAL
  Filled 2012-10-20 (×4): qty 1

## 2012-10-20 MED ORDER — ALBUTEROL SULFATE (5 MG/ML) 0.5% IN NEBU
2.5000 mg | INHALATION_SOLUTION | Freq: Four times a day (QID) | RESPIRATORY_TRACT | Status: DC
  Administered 2012-10-20 – 2012-10-22 (×7): 2.5 mg via RESPIRATORY_TRACT
  Filled 2012-10-20 (×7): qty 0.5

## 2012-10-20 MED ORDER — HEPARIN BOLUS VIA INFUSION: 2000.0000 [IU] | Freq: Once | INTRAVENOUS | Status: DC

## 2012-10-20 MED ORDER — MAGNESIUM SULFATE 40 MG/ML IJ SOLN
2.0000 g | Freq: Once | INTRAMUSCULAR | Status: AC
  Administered 2012-10-20: 2 g via INTRAVENOUS
  Filled 2012-10-20: qty 50

## 2012-10-20 MED ORDER — ACETAMINOPHEN 325 MG PO TABS
650.0000 mg | ORAL_TABLET | Freq: Four times a day (QID) | ORAL | Status: DC | PRN
  Administered 2012-10-20 (×2): 650 mg via ORAL
  Filled 2012-10-20 (×2): qty 2

## 2012-10-20 MED ORDER — HYDROCODONE-ACETAMINOPHEN 7.5-325 MG PO TABS
1.0000 | ORAL_TABLET | Freq: Four times a day (QID) | ORAL | Status: DC | PRN
  Administered 2012-10-20 – 2012-10-23 (×10): 1 via ORAL
  Filled 2012-10-20 (×10): qty 1

## 2012-10-20 MED ORDER — HEPARIN (PORCINE) IN NACL 100-0.45 UNIT/ML-% IJ SOLN: 12.0000 [IU]/kg/h | INTRAMUSCULAR | Status: DC

## 2012-10-20 MED ORDER — HEPARIN (PORCINE) IN NACL 100-0.45 UNIT/ML-% IJ SOLN
850.0000 [IU]/h | INTRAMUSCULAR | Status: DC
  Filled 2012-10-20: qty 250

## 2012-10-20 MED ORDER — METHYLPREDNISOLONE SODIUM SUCC 40 MG IJ SOLR
40.0000 mg | Freq: Two times a day (BID) | INTRAMUSCULAR | Status: DC
  Administered 2012-10-20: 40 mg via INTRAVENOUS
  Filled 2012-10-20 (×3): qty 1

## 2012-10-20 NOTE — Progress Notes (Addendum)
eLink Physician-Brief Progress Note Patient Name: [name] DOB: February 23, 1945 MRN: [id number]  Date of Service  10/20/2012   HPI/Events of Note   Elevated troponin x2   eICU Interventions   stat EKG ordered.  Patient already on pradaxa.  Discussed with pharmacy, will DC pradaxa start heparin drip at 12 legs.  He will per hour 12 hours after last pradaxa dose no bolus.  Repeat cardiac enzymes in 8 hours.  EKG NSR, no ST elevation, inversions or other concerning findings.   Intervention Category Major Interventions: Other:  Sandi Carne K. 10/20/2012, 1:17 AM

## 2012-10-20 NOTE — Progress Notes (Signed)
eLink Nursing ICU Electrolyte Replacement Protocol  Patient Name: Alexis Cuevas DOB: December 10, 1945 MRN: [id number]  Date of Service  10/20/2012   HPI/Events of Note    Recent Labs Lab 10/18/12 2217 10/19/12 0400 10/20/12 0322  NA  --  135 131*  K  --  3.8 2.8*  CL  --  100 90*  CO2  --  28 33*  GLUCOSE  --  158* 240*  BUN  --  11 11  CREATININE 0.44* 0.43* 0.42*  CALCIUM  --  8.1* 8.2*  MG  --   --  1.7  PHOS  --   --  2.9    Estimated Creatinine Clearance: 51.4 ml/min (by C-G formula based on Cr of 0.42).  Intake/Output     09/12 0701 - 09/13 0700   P.O. 180   I.V. (mL/kg) 36.6 (0.7)   Other 90   IV Piggyback 200   Total Intake(mL/kg) 506.6 (10.3)   Urine (mL/kg/hr) 2525 (2.1)   Total Output 2525   Net -2018.4       Urine Occurrence 5 x    - I/O DETAILED x24h    Total I/O In: 326.6 [I.V.:36.6; Other:90; IV Piggyback:200] Out: 2150 [Urine:2150] - I/O THIS SHIFT    ASSESSMENT   eICURN Interventions  K+2.8 Mg 1.7  Electrolyte protocol criteria met.  Replace per protocol.  MD notified   ASSESSMENT: MAJOR ELECTROLYTE    Merita Norton 10/20/2012, 5:13 AM

## 2012-10-20 NOTE — Progress Notes (Signed)
  Echocardiogram 2D Echocardiogram has been performed.  Alexis Cuevas 10/20/2012, 11:35 AM

## 2012-10-20 NOTE — Progress Notes (Addendum)
PULMONARY  / CRITICAL CARE MEDICINE  Name: Alexis Cuevas MRN: [id number] DOB: December 20, 1945    ADMISSION DATE:  10/18/2012 CONSULTATION DATE:  10/19/2012  REFERRING MD :  Peggye Pitt, MD PRIMARY SERVICE:  PCCM  CHIEF COMPLAINT:  Cough, dyspnea, altered mental status  BRIEF PATIENT DESCRIPTION: 67 yo female transferred from [facility] on 9/11 via ambulance per pt family request after being treated for acute delirium, possible PNA and question of RUL lung mass.   SIGNIFICANT EVENTS / STUDIES:  9/10 CT Chest @ [facility]: Emphysema, Bilateral pleural effusion, RUL 2.7cm oblong area of consolidated mass like appearance. Patchy bilateral GGOs with BUL predominance 9/12 Echo: LVEF 35-40% 9/14 CXR much improved.  9/14 CT chest:   LINES / TUBES:   CULTURES: Urine culture 9/8 >> GNRs  ANTIBIOTICS: Clinda 9/09 >> 9/11 Levoflox 9/07 >> 9/11 ---------Above abx administered prior to transfer--------- PCT 9/12: < 0.10,   9/13: < 0.10,  9/14: < 0.10 ESR 9/12: 70 mm/hr Cipro 9/12 >> 9/14   SUBJECTIVE:  Calm, NAD. RASS 0. + F/C  VITAL SIGNS: Temp:  [97.6 F (36.4 C)-98 F (36.7 C)] 98 F (36.7 C) (09/13 1500) Pulse Rate:  [88-109] 99 (09/13 1500) Resp:  [11-25] 19 (09/13 1500) BP: (111-155)/(55-98) 137/86 mmHg (09/13 1200) SpO2:  [93 %-100 %] 93 % (09/13 1500) Weight:  [49.1 kg (108 lb 3.9 oz)-50.8 kg (111 lb 15.9 oz)] 49.1 kg (108 lb 3.9 oz) (09/13 0340)  PHYSICAL EXAMINATION: General:  RASS 0, NAD Neuro: no focal deficits HEENT:  WNL Neck:  No JVD noted Cardiovascular:  RRR s M Lungs:  decrease bibasilar crackles, no wheezes Abdomen:  Soft, NT/ND, +BS Ext: warm, no edema  PULMONARY  Recent Labs Lab 10/19/12 1640  PHART 7.469*  PCO2ART 37.5  PO2ART 73.3*  HCO3 26.9*  TCO2 23.2  O2SAT 94.2    CBC  Recent Labs Lab 10/18/12 2217 10/19/12 0400 10/20/12 0322  HGB 10.4* 10.2* 11.7*  HCT 30.1* 29.3* 34.2*  WBC 13.5*  12.0* 13.5*  PLT 294 299 327    COAGULATION No results found for this basename: INR,  in the last 168 hours  CARDIAC    Recent Labs Lab 10/19/12 1740 10/19/12 2350 10/20/12 0731  TROPONINI 0.86* 1.57* 1.35*    Recent Labs Lab 10/19/12 1741 10/20/12 0322  PROBNP 11944.0* 23489.0*     CHEMISTRY  Recent Labs Lab 10/18/12 2217 10/19/12 0400 10/20/12 0322  NA  --  135 131*  K  --  3.8 2.8*  CL  --  100 90*  CO2  --  28 33*  GLUCOSE  --  158* 240*  BUN  --  11 11  CREATININE 0.44* 0.43* 0.42*  CALCIUM  --  8.1* 8.2*  MG  --   --  1.7  PHOS  --   --  2.9   Estimated Creatinine Clearance: 51.4 ml/min (by C-G formula based on Cr of 0.42).   LIVER No results found for this basename: AST, ALT, ALKPHOS, BILITOT, PROT, ALBUMIN, INR,  in the last 168 hours   INFECTIOUS  Recent Labs Lab 10/19/12 2254 10/20/12 0322  LATICACIDVEN  --  1.3  PROCALCITON <0.10 <0.10     ENDOCRINE CBG (last 3)  No results found for this basename: GLUCAP,  in the last 72 hours   CXR: improved interstitial infiltrates. Bilateral effusions noted on lateral view   ASSESSMENT / PLAN:  PULMONARY A: Acute and chronic hypoxic resp failure, improving COPD/emphysema  Smoker RUL mass/density Bilateral ground glass infiltrates - edema vs pneumonitis - improved Bilateral effusions - likely transudates due to CHF   P:   Cont supplemental O2 Cont bronchodilators D/C methylpred in absence of wheezing Repeat CT chest Further W/U dictated by CT findings  CARDIOVASCULAR A: Paroxysmal Atrial Fibrillation > NSR Markedly elevated BNP P:  Continue heparin, Diltiazem Decrease amiodarone to 200 mg daily Hold further Lasix for now  RENAL A: Hypokalemia P:   Monitor BMET intermittently Correct electrolytes as indicated   GASTROINTESTINAL A: No acute problems P:   Cont diet  HEMATOLOGIC A:  Anemia of chronic disease P:  Monitor intermittently   INFECTIOUS A:    Recent UTI  ?of + BC @ prior hospital P:   abx and micro as above - change to PO 9/13  ENDOCRINE A:  No acute problems  P:     NEUROLOGIC A:   Delirium, apparently resolved Possible EtOH withdrawal syndrome P:   Monitor   TODAY'S SUMMARY:     Billy Fischer, MD ; [facility] service Mobile [phone].  After 5:30 PM or weekends, call [phone]

## 2012-10-20 NOTE — Progress Notes (Signed)
ANTICOAGULATION CONSULT NOTE - Initial Consult  Pharmacy Consult for Heparin Indication: Elevated troponin x2    Allergies  Allergen Reactions  . Other Anaphylaxis    Any "cillins"  . Penicillins Anaphylaxis    Patient Measurements: Height: 5\' 7"  (170.2 cm) Weight: 108 lb 3.9 oz (49.1 kg) IBW/kg (Calculated) : 61.6 Heparin Dosing Weight:   Vital Signs: Temp: 97.9 F (36.6 C) (09/13 0340) Temp src: Oral (09/13 0340) BP: 142/91 mmHg (09/13 0400) Pulse Rate: 89 (09/13 0400)  Labs:  Recent Labs  10/18/12 2217 10/19/12 0400 10/19/12 1740 10/19/12 2350 10/20/12 0130 10/20/12 0322  HGB 10.4* 10.2*  --   --   --  11.7*  HCT 30.1* 29.3*  --   --   --  34.2*  PLT 294 299  --   --   --  327  APTT  --   --   --   --  60*  --   CREATININE 0.44* 0.43*  --   --   --  0.42*  TROPONINI  --   --  0.86* 1.57*  --   --     Estimated Creatinine Clearance: 51.4 ml/min (by C-G formula based on Cr of 0.42).   Medical History: Past Medical History  Diagnosis Date  . COPD (chronic obstructive pulmonary disease)   . Depression   . Anxiety   . Shortness of breath   . Pneumonia   . Arthritis   . GERD (gastroesophageal reflux disease)   . Headache(784.0)   . Afib   . ETOH abuse     Medications:  Infusions:  . dexmedetomidine 0.3 mcg/kg/hr (10/20/12 0325)  . heparin      Assessment: Patient with Elevated troponin x2 on pradaxa.  MD wishes to have heparin pharmacy.  Patient renal function currently >93mL/min.   Goal of Therapy:  Heparin level 0.3-0.7 units/ml Monitor platelets by anticoagulation protocol: Yes   Plan:  D/C pradaxa Heparin (without bolus) at 700 units/hr Heparin level at 1800 Daily CBC, heparin level  Aleene Davidson Crowford 10/20/2012,5:26 AM

## 2012-10-20 NOTE — Progress Notes (Addendum)
ANTICOAGULATION CONSULT NOTE - Initial Consult  Pharmacy Consult for Heparin Indication: Elevated troponin x2    Allergies  Allergen Reactions  . Other Anaphylaxis    Any "cillins"  . Penicillins Anaphylaxis    Patient Measurements: Height: 5\' 7"  (170.2 cm) Weight: 108 lb 3.9 oz (49.1 kg) IBW/kg (Calculated) : 61.6 Heparin Dosing Weight:   Vital Signs: Temp: 98 F (36.7 C) (09/13 1500) Temp src: Oral (09/13 1500) BP: 124/81 mmHg (09/13 1700) Pulse Rate: 95 (09/13 1700)  Labs:  Recent Labs  10/18/12 2217 10/19/12 0400 10/19/12 1740 10/19/12 2350 10/20/12 0130 10/20/12 0322 10/20/12 0731 10/20/12 1755  HGB 10.4* 10.2*  --   --   --  11.7*  --   --   HCT 30.1* 29.3*  --   --   --  34.2*  --   --   PLT 294 299  --   --   --  327  --   --   APTT  --   --   --   --  60*  --  51*  --   HEPARINUNFRC  --   --   --   --   --   --   --  <0.10*  CREATININE 0.44* 0.43*  --   --   --  0.42*  --   --   TROPONINI  --   --  0.86* 1.57*  --   --  1.35*  --     Estimated Creatinine Clearance: 51.4 ml/min (by C-G formula based on Cr of 0.42).   Medical History: Past Medical History  Diagnosis Date  . COPD (chronic obstructive pulmonary disease)   . Depression   . Anxiety   . Shortness of breath   . Pneumonia   . Arthritis   . GERD (gastroesophageal reflux disease)   . Headache(784.0)   . Afib   . ETOH abuse     Medications:  Infusions:  . heparin 700 Units/hr (10/20/12 1021)    Assessment: 67 YOF started on heparin gtt for elevated troponin and afib, she was on dabigatran prior to admission, last dose 9/12 22:30.    Initial heparin level < 0.1 on 700 units/hr (started at 10:30am per last pradaxa dosing)  CBC: hgb = 11.7, platelets WNL  Goal of Therapy:  Heparin level 0.3-0.7 units/ml Monitor platelets by anticoagulation protocol: Yes   Plan:   First heparin level low, increase heparin heparin to 850 units/hr  Heparin level at 02:00  Daily CBC,  heparin level  Juliette Alcide, PharmD, BCPS.   Pager: [phone]  10/20/2012,6:30 PM

## 2012-10-20 NOTE — Significant Event (Signed)
No distress on 2 lpm [place] with net I/Os negative 1.5 liters and required K+ repletion earlier in day. Will D/C further Lasix for now   Billy Fischer, MD ; [facility] [phone].  After 5:30 PM or weekends, call [phone]

## 2012-10-20 NOTE — Progress Notes (Signed)
[facility] PROGRESS NOTE  Alexis Cuevas OZH:[id number] DOB: [birth year] DOA: 10/18/2012 PCP: No primary provider on file.  Assessment/Plan: Acute Encephalopathy -Presumed metabolic from PNA/UTI, altho questionable if she may have brain mets (unable to do MRI as she has metal in her jaw).  -CT Head from outside hospital was read as negative. -Seems to be improving: she is oriented, but still has visual hallucinations.   UTI -Reported cultures from outside hospital with pansensitive E coli. -CCM has switched levaquin to cipro.  PNA/Lung Infiltrates. -Hard to tell if true PNA vs fibrosis from amiodarone vs pulmonary edema. -Seen on CT chest at outside facility. -Has been started on lasix. -ECHO pending. -Appreciate CCM input.  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 -Highly suspicious for malignancy in this smoker. -Have discussed with Pulmonary, They have decided to hold diagnosis and treatment of this for the moment until other acute issues improved. -Patient and family interested in pursuing diagnosis and treatment.  COPD with Acute Exacerbation -Continue IV steroids, will scheduled nebs as well as PRN. -She is chronically oxygen dependant.   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 -Decision was made to treat her medically with Pradaxa. -Pradaxa has been discontinued in favor of heparin given elevated troponins.  ETOH Abuse -Continue thiamine. -Do not believe she is at risk for withdrawals at this point as she has been hospitalized for 7 days now. -Not clear why she was started on precedex, this is presumably for ETOH withdrawals, however she had confusion/hallucinations even prior to being admitted when she was still drinking beer at home.  Elevated Troponin -Could certainly be a primary cardiac event. -Has been heparinized. -ECHO ordered and pending. -Will request cardiology evaluation. [facility] consulted. -Elevated troponin could certainly be related  to her COPD.  Tobacco Abuse -Nicotine patch. -Counseled on cessation.  DVT prophylaxis -On pradaxa.  Code Status: Full code Family Communication: No family at bedside. Disposition Plan: Home when medically stable.   Consultants:  Pulmonary  Cardiology   Antibiotics:  Cipro  Subjective: More sedated today. Presumably related to the precedex drip.  Objective: Filed Vitals:   10/20/12 0500 10/20/12 0600 10/20/12 0700 10/20/12 0800  BP: 133/80 142/93 142/88   Pulse: 89 95 89   Temp:    97.6 F (36.4 C)  TempSrc:    Oral  Resp: 20 21 20    Height:      Weight:      SpO2: 97% 97% 96%     Intake/Output Summary (Last 24 hours) at 10/20/12 0841 Last data filed at 10/20/12 0603  Gross per 24 hour  Intake 584.16 ml  Output   2150 ml  Net -1565.84 ml   Filed Weights   10/18/12 2047 10/19/12 1800 10/20/12 0340  Weight: 54.8 kg (120 lb 13 oz) 50.8 kg (111 lb 15.9 oz) 49.1 kg (108 lb 3.9 oz)    Exam:   General:  Drowsy, will fidget a little when prodded.  Cardiovascular: RRR, no M/R/G  Respiratory: Diffuse wheezing and ronchi, fair air movement.  Abdomen: S/NT/ND/+BS  Extremities: no C/C/E   Neurologic:  Non-focal  Data Reviewed: Basic Metabolic Panel:  Recent Labs Lab 10/18/12 2217 10/19/12 0400 10/20/12 0322  NA  --  135 131*  K  --  3.8 2.8*  CL  --  100 90*  CO2  --  28 33*  GLUCOSE  --  158* 240*  BUN  --  11 11  CREATININE 0.44* 0.43* 0.42*  CALCIUM  --  8.1* 8.2*  MG  --   --  1.7  PHOS  --   --  2.9   Liver Function Tests: No results found for this basename: AST, ALT, ALKPHOS, BILITOT, PROT, ALBUMIN,  in the last 168 hours No results found for this basename: LIPASE, AMYLASE,  in the last 168 hours No results found for this basename: AMMONIA,  in the last 168 hours CBC:  Recent Labs Lab 10/18/12 2217 10/19/12 0400 10/20/12 0322  WBC 13.5* 12.0* 13.5*  NEUTROABS  --  11.2* 12.4*  HGB 10.4* 10.2* 11.7*  HCT 30.1* 29.3* 34.2*   MCV 88.5 89.3 88.8  PLT 294 299 327   Cardiac Enzymes:  Recent Labs Lab 10/19/12 1740 10/19/12 2350 10/20/12 0731  TROPONINI 0.86* 1.57* 1.35*   BNP (last 3 results)  Recent Labs  10/19/12 1741 10/20/12 0322  PROBNP 11944.0* 23489.0*   CBG: No results found for this basename: GLUCAP,  in the last 168 hours  Recent Results (from the past 240 hour(s))  MRSA PCR SCREENING     Status: None   Collection Time    10/19/12  5:53 PM      Result Value Range Status   MRSA by PCR NEGATIVE  NEGATIVE Final   Comment:            The GeneXpert MRSA Assay (FDA     approved for NASAL specimens     only), is one component of a     comprehensive MRSA colonization     surveillance program. It is not     intended to diagnose MRSA     infection nor to guide or     monitor treatment for     MRSA infections.     Studies: Dg Chest Port 1 View  10/19/2012   *RADIOLOGY REPORT*  Clinical Data: Cough and congestion.   Smoker for 60 years.  PORTABLE CHEST - 1 VIEW  Comparison: None.  Findings: Diffuse increased lung markings.  Some of findings may be chronic in origin but difficult to assess without comparison. There may be superimposed pulmonary edema or possibly infectious infiltrate in the proper clinical setting.  Slight patchiness of lung changes limit detection of underlying mass.  Stability and/or clearing will need to be confirmed on follow-up.  Heart size within normal limits.  Calcified slightly tortuous aorta.  No gross pneumothorax.  Blunting right costophrenic angle may represent presence of small effusion.  IMPRESSION: Diffuse increased lung markings may represent pulmonary edema superimposed on chronic changes.  Follow up recommended as noted above.   Original Report Authenticated By: Lacy Duverney, M.D.    Scheduled Meds: . ipratropium  0.5 mg Nebulization Q4H   And  . albuterol  2.5 mg Nebulization Q4H  . amiodarone  200 mg Oral BID  . antiseptic oral rinse  15 mL Mouth Rinse  BID  . ciprofloxacin  400 mg Intravenous BID  . diltiazem  240 mg Oral Daily  . feeding supplement  237 mL Oral BID BM  . folic acid  1 mg Oral Daily  . furosemide  40 mg Intravenous BID  . magnesium sulfate 1 - 4 g bolus IVPB  2 g Intravenous Once  . methylPREDNISolone (SOLU-MEDROL) injection  80 mg Intravenous Q12H  . multivitamin with minerals  1 tablet Oral Daily  . nicotine  21 mg Transdermal Q24H  . pneumococcal 23 valent vaccine  0.5 mL Intramuscular Tomorrow-1000  . potassium chloride  40 mEq Oral Q4H  . sertraline  150 mg  Oral Daily  . thiamine  100 mg Oral Daily   Or  . thiamine  100 mg Intravenous Daily   Continuous Infusions: . dexmedetomidine 0.2 mcg/kg/hr (10/20/12 8119)  . heparin      Principal Problem:   Acute encephalopathy Active Problems:   ETOH abuse   UTI (urinary tract infection)   PNA (pneumonia)   Lung mass   Alcohol withdrawal   Carotid stenosis   COPD with acute exacerbation   Acute respiratory failure with hypoxia   CHF with unknown LVEF    Time spent: 45 minutes    HERNANDEZ ACOSTA,ESTELA  [facility] Pager [phone]  If 7PM-7AM, please contact night-coverage at [email or web address], password [facility] 10/20/2012, 8:41 AM  LOS: 2 days

## 2012-10-20 NOTE — Evaluation (Signed)
Physical Therapy Evaluation Patient Details Name: Alexis Cuevas MRN: [id number] DOB: 05/01/1945 Today's Date: 10/20/2012 Time: 2841-3244 PT Time Calculation (min): 24 min  PT Assessment / Plan / Recommendation History of Present Illness  This is a 67yo female with a history of afib/ETOH abuse,COPD who was transferred from [facility] on 9/11 per family request to be treated for acute delirium, possible PNA and RUL mass.  She apparently has o2 dependent COPD, and chronic ETOH use and was admitted with altered mental status and was found to have a UTI.  SHe was found on chest xray to have a new lung mass.  She went through ETOH withdrawal while hospitalized.  She was started on Levaquin for UTI and PNA.  On admission at [facility] she was found to have an elevated troponin and Cardiology is now asked to consult.  She denies any history of chest pain and no LE edema.  She has a history of atrial fibrillation and is on amiodarone.  Clinical Impression  Pt will benefit from PT to address deficits below;      PT Assessment  Patient needs continued PT services    Follow Up Recommendations  Home health PT    Does the patient have the potential to tolerate intense rehabilitation      Barriers to Discharge        Equipment Recommendations  None recommended by PT (TBA)    Recommendations for Other Services     Frequency Min 3X/week    Precautions / Restrictions Precautions Precautions: Fall   Pertinent Vitals/Pain VSS during       Mobility  Bed Mobility Bed Mobility: Supine to Sit Supine to Sit: 4: Min assist;3: Mod assist Details for Bed Mobility Assistance: cues for technique Transfers Transfers: Sit to Stand;Stand to Sit Sit to Stand: 4: Min assist;3: Mod assist Stand to Sit: 4: Min assist;3: Mod assist Details for Transfer Assistance: cues for hand placement Ambulation/Gait Ambulation/Gait Assistance: 1: +2 Total assist Ambulation/Gait: Patient Percentage:  70% Ambulation Distance (Feet): 60 Feet Assistive device: Rolling walker;2 person hand held assist Ambulation/Gait Assistance Details: +2 for lines, safety, balance; pt unsteady Gait Pattern: Step-through pattern;Narrow base of support    Exercises     PT Diagnosis: Difficulty walking  PT Problem List: Decreased strength;Decreased range of motion;Decreased activity tolerance;Decreased balance;Decreased mobility;Decreased safety awareness;Decreased knowledge of use of DME PT Treatment Interventions: DME instruction;Gait training;Functional mobility training;Therapeutic activities;Therapeutic exercise;Patient/family education;Balance training     PT Goals(Current goals can be found in the care plan section) Acute Rehab PT Goals Patient Stated Goal: get stronger PT Goal Formulation: With patient Time For Goal Achievement: 11/03/12 Potential to Achieve Goals: Good  Visit Information  Last PT Received On: 10/20/12 Assistance Needed: +1 History of Present Illness: This is a 67yo female with a history of afib/ETOH abuse,COPD who was transferred from [facility] on 9/11 per family request to be treated for acute delirium, possible PNA and RUL mass.  She apparently has o2 dependent COPD, and chronic ETOH use and was admitted with altered mental status and was found to have a UTI.  SHe was found on chest xray to have a new lung mass.  She went through ETOH withdrawal while hospitalized.  She was started on Levaquin for UTI and PNA.  On admission at [facility] she was found to have an elevated troponin and Cardiology is now asked to consult.  She denies any history of chest pain and no LE edema.  She has a history  of atrial fibrillation and is on amiodarone.       Prior Functioning  Home Living Family/patient expects to be discharged to:: Private residence Living Arrangements: Spouse/significant other Available Help at Discharge: Family Type of Home: Mobile home Home Access: Stairs to  enter Secretary/administrator of Steps: 3 Entrance Stairs-Rails: Right Home Layout: One level Home Equipment: Cane - single point Additional Comments: O2 Prior Function Level of Independence: Independent Communication Communication: No difficulties    Cognition  Cognition Arousal/Alertness: Awake/alert Behavior During Therapy: WFL for tasks assessed/performed Overall Cognitive Status: Within Functional Limits for tasks assessed    Extremity/Trunk Assessment Upper Extremity Assessment Upper Extremity Assessment: Generalized weakness Lower Extremity Assessment Lower Extremity Assessment: Generalized weakness   Balance    End of Session PT - End of Session Equipment Utilized During Treatment: Gait belt Activity Tolerance: Patient tolerated treatment well Patient left: in chair;with call bell/phone within reach;with family/visitor present  GP     [facility] 10/20/2012, 5:14 PM

## 2012-10-20 NOTE — Consult Note (Addendum)
Admit date: 10/18/2012 Referring Physician  Dr. Ardyth Harps Primary Cardiologist  NONE Reason for Consultation  Acute CHF and atrial fibrillation  HPI: This is a 67yo female with a history of afib/ETOH abuse,COPD who was transferred from [facility] on 9/11 per family request to be treated for acute delirium, possible PNA and RUL mass.  She apparently has o2 dependent COPD, and chronic ETOH use and was admitted with altered mental status and was found to have a UTI.  SHe was found on chest xray to have a new lung mass.  She went through ETOH withdrawal while hospitalized.  She was started on Levaquin for UTI and PNA.  On admission at [facility] she was found to have an elevated troponin and Cardiology is now asked to consult.  She denies any history of chest pain and no LE edema.  She has a history of atrial fibrillation and is on amiodarone.     PMH:   Past Medical History  Diagnosis Date  . COPD (chronic obstructive pulmonary disease)   . Depression   . Anxiety   . Shortness of breath   . Pneumonia   . Arthritis   . GERD (gastroesophageal reflux disease)   . Headache(784.0)   . Afib   . ETOH abuse      PSH:   Past Surgical History  Procedure Laterality Date  . Abdominal hysterectomy    . Cholecystectomy    . Rectocele repair    . Jaw surgery with pins    . Fracture surgery      right arm    Allergies:  Other and Penicillins Prior to Admit Meds:   Prescriptions prior to admission  Medication Sig Dispense Refill  . amiodarone (PACERONE) 200 MG tablet Take 200 mg by mouth 2 (two) times daily.      . dabigatran (PRADAXA) 150 MG CAPS capsule Take 150 mg by mouth every 12 (twelve) hours.      Marland Kitchen diltiazem (TIAZAC) 240 MG 24 hr capsule Take 240 mg by mouth daily.      Marland Kitchen HYDROcodone-acetaminophen (NORCO) 7.5-325 MG per tablet Take 1 tablet by mouth every 6 (six) hours as needed for pain.      Marland Kitchen loratadine (CLARITIN) 10 MG tablet Take 10 mg by mouth daily.      Marland Kitchen LORazepam  (ATIVAN) 1 MG tablet Take 1 mg by mouth every 6 (six) hours as needed for anxiety.      . nicotine (NICODERM CQ - DOSED IN MG/24 HOURS) 21 mg/24hr patch Place 1 patch onto the skin daily.      . sertraline (ZOLOFT) 100 MG tablet Take 150 mg by mouth daily.       Fam HX:   History reviewed. No pertinent family history. Social HX:    History   Social History  . Marital Status: N/A    Spouse Name: N/A    Number of Children: N/A  . Years of Education: N/A   Occupational History  . Not on file.   Social History Main Topics  . Smoking status: Current Every Day Smoker -- 2.00 packs/day for 60 years    Types: Cigarettes  . Smokeless tobacco: Not on file  . Alcohol Use: 12.6 oz/week    21 Cans of beer per week  . Drug Use: Not on file  . Sexual Activity: Not on file   Other Topics Concern  . Not on file   Social History Narrative  . No narrative on file  ROS:  All 11 ROS were addressed and are negative except what is stated in the HPI  Physical Exam: Blood pressure 143/94, pulse 109, temperature 97.6 F (36.4 C), temperature source Oral, resp. rate 20, height 5\' 7"  (1.702 m), weight 49.1 kg (108 lb 3.9 oz), SpO2 94.00%.    General: Well developed, well nourished, in no acute distress Head: Eyes PERRLA, No xanthomas.   Normal cephalic and atramatic  Lungs:   Diffuse crackles throughout both lung fields Heart:   HRRR S1 S2 Pulses are 2+ & equal.            No carotid bruit. No JVD.  No abdominal bruits. No femoral bruits. Abdomen: Bowel sounds are positive, abdomen soft and non-tender without masses  Extremities:   No clubbing, cyanosis or edema.  DP +1 Neuro: Alert and oriented X 3. Psych:  Good affect, responds appropriately    Labs:   Lab Results  Component Value Date   WBC 13.5* 10/20/2012   HGB 11.7* 10/20/2012   HCT 34.2* 10/20/2012   MCV 88.8 10/20/2012   PLT 327 10/20/2012    Recent Labs Lab 10/20/12 0322  NA 131*  K 2.8*  CL 90*  CO2 33*  BUN 11   CREATININE 0.42*  CALCIUM 8.2*  GLUCOSE 240*   No results found for this basename: PTT   No results found for this basename: INR, PROTIME   Lab Results  Component Value Date   TROPONINI 1.35* 10/20/2012         Radiology:  Dg Chest Port 1 View  10/19/2012   *RADIOLOGY REPORT*  Clinical Data: Cough and congestion.   Smoker for 60 years.  PORTABLE CHEST - 1 VIEW  Comparison: None.  Findings: Diffuse increased lung markings.  Some of findings may be chronic in origin but difficult to assess without comparison. There may be superimposed pulmonary edema or possibly infectious infiltrate in the proper clinical setting.  Slight patchiness of lung changes limit detection of underlying mass.  Stability and/or clearing will need to be confirmed on follow-up.  Heart size within normal limits.  Calcified slightly tortuous aorta.  No gross pneumothorax.  Blunting right costophrenic angle may represent presence of small effusion.  IMPRESSION: Diffuse increased lung markings may represent pulmonary edema superimposed on chronic changes.  Follow up recommended as noted above.   Original Report Authenticated By: Lacy Duverney, M.D.    EKG:  NSR with prolonged QTc  ASSESSMENT:  1.  Elevated  Troponin most c/s demand ischemia from PNA /acute CHF and COPD 2.  PNA 3.  UTI 4.  Mental status changes secondary to #2 and #3 5.  Lung mass 6.  Acute CHF - 2D echo pending to assess LVF 7.  Hypokalemia 8.  PAF on Pradaxa and Amiodarone  PLAN:   1.  Agree with IV diuresis 2.  Replete potassium 3.  Will check results of 2D echo 4.  Check CPK and MB  Quintella Reichert, MD  10/20/2012  12:14 PM

## 2012-10-21 ENCOUNTER — Encounter (HOSPITAL_COMMUNITY): Payer: Self-pay | Admitting: Radiology

## 2012-10-21 ENCOUNTER — Inpatient Hospital Stay (HOSPITAL_COMMUNITY): Payer: Medicare Other

## 2012-10-21 DIAGNOSIS — Z9229 Personal history of other drug therapy: Secondary | ICD-10-CM

## 2012-10-21 DIAGNOSIS — I4891 Unspecified atrial fibrillation: Secondary | ICD-10-CM

## 2012-10-21 DIAGNOSIS — I5043 Acute on chronic combined systolic (congestive) and diastolic (congestive) heart failure: Secondary | ICD-10-CM | POA: Insufficient documentation

## 2012-10-21 DIAGNOSIS — I509 Heart failure, unspecified: Secondary | ICD-10-CM

## 2012-10-21 LAB — CBC WITH DIFFERENTIAL/PLATELET
Eosinophils Relative: 0 % (ref 0–5)
HCT: 37.1 % (ref 36.0–46.0)
Hemoglobin: 12.6 g/dL (ref 12.0–15.0)
Lymphocytes Relative: 5 % — ABNORMAL LOW (ref 12–46)
Lymphs Abs: 0.7 10*3/uL (ref 0.7–4.0)
MCV: 89.6 fL (ref 78.0–100.0)
Monocytes Absolute: 0.5 10*3/uL (ref 0.1–1.0)
Monocytes Relative: 3 % (ref 3–12)
RBC: 4.14 MIL/uL (ref 3.87–5.11)
RDW: 15.1 % (ref 11.5–15.5)
WBC: 15.7 10*3/uL — ABNORMAL HIGH (ref 4.0–10.5)

## 2012-10-21 LAB — BASIC METABOLIC PANEL
BUN: 17 mg/dL (ref 6–23)
CO2: 32 mEq/L (ref 19–32)
GFR calc non Af Amer: >90 mL/min (ref >90)
Glucose, Bld: 184 mg/dL — ABNORMAL HIGH (ref 70–99)
Potassium: 4.3 mEq/L (ref 3.5–5.1)

## 2012-10-21 LAB — HEPARIN LEVEL (UNFRACTIONATED)
Heparin Unfractionated: 0.17 IU/mL — ABNORMAL LOW (ref 0.30–0.70)
Heparin Unfractionated: 0.3 IU/mL (ref 0.30–0.70)

## 2012-10-21 MED ORDER — IOHEXOL 300 MG/ML  SOLN
80.0000 mL | Freq: Once | INTRAMUSCULAR | Status: AC | PRN
  Administered 2012-10-21: 80 mL via INTRAVENOUS

## 2012-10-21 MED ORDER — AMIODARONE HCL 200 MG PO TABS
200.0000 mg | ORAL_TABLET | Freq: Every day | ORAL | Status: DC
  Administered 2012-10-21 – 2012-10-23 (×3): 200 mg via ORAL
  Filled 2012-10-21 (×3): qty 1

## 2012-10-21 MED ORDER — HEPARIN (PORCINE) IN NACL 100-0.45 UNIT/ML-% IJ SOLN
1000.0000 [IU]/h | INTRAMUSCULAR | Status: DC
  Administered 2012-10-21: 1000 [IU]/h via INTRAVENOUS
  Filled 2012-10-21 (×3): qty 250

## 2012-10-21 MED ORDER — FUROSEMIDE 10 MG/ML IJ SOLN
40.0000 mg | Freq: Once | INTRAMUSCULAR | Status: AC
  Administered 2012-10-21: 40 mg via INTRAVENOUS
  Filled 2012-10-21: qty 4

## 2012-10-21 MED ORDER — HEPARIN (PORCINE) IN NACL 100-0.45 UNIT/ML-% IJ SOLN
1150.0000 [IU]/h | INTRAMUSCULAR | Status: DC
  Filled 2012-10-21: qty 250

## 2012-10-21 NOTE — Progress Notes (Signed)
[facility] PROGRESS NOTE  Alexis Cuevas ZOX:[id number] DOB: Feb 24, 1945 DOA: 10/18/2012 PCP: No primary provider on file.  Assessment/Plan: Acute Encephalopathy -Presumed metabolic from PNA/UTI, or from ETOH withdrawals.  -She is almost back at baseline per family reports.  UTI -Reported cultures from outside hospital with pansensitive E coli. -Has completed course of antibiotics.  PNA/Lung Infiltrates/?Lung Mass -Repeat CT shows improving infiltrates most c/w pulmonary edema. -No evidence of a lung mass. -Would recommend repeating chest CT in 3 months.  Acute Combined CHF -ECHO with EF 35-40% and diastolic dysfunction. -Is 2 L negative. -Will give another dose of lasix today. -Await cards input.  Atrial Fibrillation -Rate controlled. -Was on Pradaxa prior to admission. -Currently on heparin drip given elevated troponins.  COPD with Acute Exacerbation -Steroids have been discontinued. -She is chronically oxygen dependant.   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 -Decision was made to treat her medically with Pradaxa. -Pradaxa has been discontinued in favor of heparin given elevated troponins.  ETOH Abuse -Continue thiamine. -Do not believe she is at risk for withdrawals at this point as she has been hospitalized for 7 days now.  Elevated Troponin -Could certainly be a primary cardiac event. -Has been heparinized. -ECHO ordered and pending. -Will request cardiology evaluation. [facility] consulted. -Elevated troponin could certainly be related to her COPD.  Tobacco Abuse -Nicotine patch. -Counseled on cessation.  DVT prophylaxis -On pradaxa.  Code Status: Full code Family Communication: No family at bedside. Discussed with daughter Inetta Fermo via telephone. Disposition Plan: Home when medically stable.   Consultants:  Pulmonary  Cardiology   Antibiotics:  Cipro  Subjective: No complaints. Oriented.  Objective: Filed Vitals:    10/21/12 0700 10/21/12 0800 10/21/12 0908 10/21/12 1226  BP:  141/91  126/75  Pulse: 88 95  84  Temp:  97.5 F (36.4 C)  98 F (36.7 C)  TempSrc:  Oral  Oral  Resp:  25  18  Height:      Weight:      SpO2: 96% 99% 98% 97%    Intake/Output Summary (Last 24 hours) at 10/21/12 1340 Last data filed at 10/21/12 1310  Gross per 24 hour  Intake 1019.45 ml  Output   1750 ml  Net -730.55 ml   Filed Weights   10/19/12 1800 10/20/12 0340 10/21/12 0400  Weight: 50.8 kg (111 lb 15.9 oz) 49.1 kg (108 lb 3.9 oz) 49.7 kg (109 lb 9.1 oz)    Exam:   General:  AA Ox3, NAD  Cardiovascular: RRR, no M/R/G  Respiratory: Mild bilateral crackles  Abdomen: S/NT/ND/+BS  Extremities: no C/C/E   Neurologic:  Non-focal  Data Reviewed: Basic Metabolic Panel:  Recent Labs Lab 10/18/12 2217 10/19/12 0400 10/20/12 0322 10/20/12 1755 10/21/12 0131  NA  --  135 131* 135 135  K  --  3.8 2.8* 3.9 4.3  CL  --  100 90* 93* 94*  CO2  --  28 33* 30 32  GLUCOSE  --  158* 240* 270* 184*  BUN  --  11 11 17 17   CREATININE 0.44* 0.43* 0.42* 0.52 0.57  CALCIUM  --  8.1* 8.2* 8.2* 8.1*  MG  --   --  1.7  --  2.1  PHOS  --   --  2.9  --  1.7*   Liver Function Tests: No results found for this basename: AST, ALT, ALKPHOS, BILITOT, PROT, ALBUMIN,  in the last 168 hours No results found for this basename: LIPASE, AMYLASE,  in the  last 168 hours No results found for this basename: AMMONIA,  in the last 168 hours CBC:  Recent Labs Lab 10/18/12 2217 10/19/12 0400 10/20/12 0322 10/21/12 0131  WBC 13.5* 12.0* 13.5* 15.7*  NEUTROABS  --  11.2* 12.4* 14.5*  HGB 10.4* 10.2* 11.7* 12.6  HCT 30.1* 29.3* 34.2* 37.1  MCV 88.5 89.3 88.8 89.6  PLT 294 299 327 390   Cardiac Enzymes:  Recent Labs Lab 10/19/12 1740 10/19/12 2350 10/20/12 0731  TROPONINI 0.86* 1.57* 1.35*   BNP (last 3 results)  Recent Labs  10/19/12 1741 10/20/12 0322  PROBNP 11944.0* 23489.0*   CBG: No results found for  this basename: GLUCAP,  in the last 168 hours  Recent Results (from the past 240 hour(s))  MRSA PCR SCREENING     Status: None   Collection Time    10/19/12  5:53 PM      Result Value Range Status   MRSA by PCR NEGATIVE  NEGATIVE Final   Comment:            The GeneXpert MRSA Assay (FDA     approved for NASAL specimens     only), is one component of a     comprehensive MRSA colonization     surveillance program. It is not     intended to diagnose MRSA     infection nor to guide or     monitor treatment for     MRSA infections.     Studies: Dg Chest 2 View  10/21/2012   *RADIOLOGY REPORT*  Clinical Data: Follow-up infiltrates, cough, pneumonia  CHEST - 2 VIEW  Comparison: 10/19/2012  Findings: Heart size and vascular pattern normal.  Hyperinflation consistent with COPD.  Small bilateral pleural effusions.  Diffuse interstitial infiltrates have nearly completely resolved.  Nodular density over the left lung base appears to represent a probable nipple shadow.  IMPRESSION: Significant improvement in bilateral interstitial infiltrates consistent with resolving pulmonary edema superimposed on COPD. Probable left base nipple shadow.   Original Report Authenticated By: Esperanza Heir, M.D.   Ct Chest W Contrast  10/21/2012   *RADIOLOGY REPORT*  Clinical Data: Evaluate infiltrates right upper lobe density  CT CHEST WITH CONTRAST  Technique:  Multidetector CT imaging of the chest was performed following the standard protocol during bolus administration of intravenous contrast.  Contrast: 80mL OMNIPAQUE IOHEXOL 300 MG/ML  SOLN  Comparison: Chest x-ray obtained earlier today at 07:54 a.m.  Findings:  Mediastinum: Prominence, borderline enlarged by CT criteria mediastinal lymph nodes.  Index right paratracheal node measures 12.5 mm in short axis.  The prevascular node measures 9 mm in short axis.  Hilar lymphoid tissue is mildly prominent.  Heart/Vascular: Conventional three-vessel arch anatomy.  Atherosclerotic calcifications are noted at the origin of the great vessels without significant appearing stenosis.  No aortic aneurysmal dilatation or dissection.  The heart is within normal limits for size.  No pericardial effusion.  Atherosclerotic calcifications noted throughout the coronary arteries including the left main coronary artery.  There is calcification of the mitral valve annulus and aortic valve leaflets.  No large central pulmonary embolus.  Lungs/Pleura: Moderate bilateral layering pleural effusions slightly larger on the right than the left.  Interlobular septal thickening with patchy areas of ground-glass attenuation opacity in the bilateral lungs is most suggestive of mild interstitial and alveolar pulmonary edema.  There is a background of centrilobular emphysema and diffuse mild bronchial wall thickening suggesting underlying COPD.  Linear atelectasis versus scarring noted within the right  middle lobe.  There is compressive atelectasis in the bilateral lower lobes secondary to the presence of the pleural effusions. Several scattered small pulmonary nodules are noted including a 2 mm nodule in the periphery of the right upper lobe (image 21, series 5) and a small cluster of nodules in the posterior aspect of the superior segment of the right lower lobe with a maximal size of 3 mm (image 33, series 5).  Upper Abdomen: Surgical changes of prior cholecystectomy.  Mild prominence of the extrahepatic biliary duct without significant dilatation.  Scattered atherosclerotic vascular calcifications. Otherwise, limited imaging of the upper abdomen is unremarkable.  Bones: No acute fracture or aggressive appearing lytic or blastic osseous lesion.  IMPRESSION:  1.  CT findings are most consistent with mild interstitial and alveolar edema.  2.  Bilateral moderate pleural effusions slightly larger on the right than the left with associated lower lobe atelectasis.  3.  Centrilobular emphysema  4.  Pulmonary  hyperexpansion and diffuse bronchial wall thickening suggests underlying chronic bronchitis/COPD  5.  Atherosclerosis including coronary artery disease.  Calcified coronary artery plaque is noted in the left main coronary artery.  6.  Scattered 2 - 3 mm pulmonary nodules likely reflect an underlying infectious/inflammatory or granulomatous process.  7.  Nonspecific borderline mediastinal adenopathy may be reactive or related to chronic recurrent congestive heart failure.  Consider repeat chest CT in 3 - 6 months to assess for the stability of the small pulmonary nodules and borderline mediastinal adenopathy.   Original Report Authenticated By: Malachy Moan, M.D.   Dg Chest Port 1 View  10/19/2012   *RADIOLOGY REPORT*  Clinical Data: Cough and congestion.   Smoker for 60 years.  PORTABLE CHEST - 1 VIEW  Comparison: None.  Findings: Diffuse increased lung markings.  Some of findings may be chronic in origin but difficult to assess without comparison. There may be superimposed pulmonary edema or possibly infectious infiltrate in the proper clinical setting.  Slight patchiness of lung changes limit detection of underlying mass.  Stability and/or clearing will need to be confirmed on follow-up.  Heart size within normal limits.  Calcified slightly tortuous aorta.  No gross pneumothorax.  Blunting right costophrenic angle may represent presence of small effusion.  IMPRESSION: Diffuse increased lung markings may represent pulmonary edema superimposed on chronic changes.  Follow up recommended as noted above.   Original Report Authenticated By: Lacy Duverney, M.D.    Scheduled Meds: . ipratropium  0.5 mg Nebulization Q6H   And  . albuterol  2.5 mg Nebulization Q6H  . amiodarone  200 mg Oral Daily  . antiseptic oral rinse  15 mL Mouth Rinse BID  . diltiazem  240 mg Oral Daily  . feeding supplement  237 mL Oral BID BM  . folic acid  1 mg Oral Daily  . multivitamin with minerals  1 tablet Oral Daily  .  nicotine  21 mg Transdermal Q24H  . sertraline  150 mg Oral Daily  . thiamine  100 mg Oral Daily   Or  . thiamine  100 mg Intravenous Daily   Continuous Infusions: . heparin 1,000 Units/hr (10/21/12 0422)    Principal Problem:   Acute encephalopathy Active Problems:   Acute on chronic combined systolic and diastolic CHF (congestive heart failure)   ETOH abuse   UTI (urinary tract infection)   PNA (pneumonia)   Alcohol withdrawal   Carotid stenosis   COPD with acute exacerbation   Acute respiratory failure with hypoxia  Elevated troponin   H/O amiodarone therapy   Atrial fibrillation   HX: anticoagulation    Time spent: 35 minutes    HERNANDEZ ACOSTA,ESTELA  [facility] Pager [phone]  If 7PM-7AM, please contact night-coverage at [email or web address], password [facility] 10/21/2012, 1:40 PM  LOS: 3 days

## 2012-10-21 NOTE — Progress Notes (Signed)
ANTICOAGULATION CONSULT NOTE -Follow Up Consult  Pharmacy Consult for Heparin Indication: Elevated troponin x2    Allergies  Allergen Reactions  . Other Anaphylaxis    Any "cillins"  . Penicillins Anaphylaxis    Patient Measurements: Height: 5\' 7"  (170.2 cm) Weight: 108 lb 3.9 oz (49.1 kg) IBW/kg (Calculated) : 61.6    Vital Signs: Temp: 98.2 F (36.8 C) (09/13 2000) Temp src: Axillary (09/13 2000) BP: 130/87 mmHg (09/14 0200) Pulse Rate: 89 (09/14 0300)  Labs:  Recent Labs  10/19/12 0400 10/19/12 1740 10/19/12 2350 10/20/12 0130 10/20/12 0322 10/20/12 0731 10/20/12 1755 10/21/12 0131  HGB 10.2*  --   --   --  11.7*  --   --  12.6  HCT 29.3*  --   --   --  34.2*  --   --  37.1  PLT 299  --   --   --  327  --   --  390  APTT  --   --   --  60*  --  51*  --   --   HEPARINUNFRC  --   --   --   --   --   --  <0.10* 0.17*  CREATININE 0.43*  --   --   --  0.42*  --  0.52 0.57  TROPONINI  --  0.86* 1.57*  --   --  1.35*  --   --     Estimated Creatinine Clearance: 51.4 ml/min (by C-G formula based on Cr of 0.57).   Medical History: Past Medical History  Diagnosis Date  . COPD (chronic obstructive pulmonary disease)   . Depression   . Anxiety   . Shortness of breath   . Pneumonia   . Arthritis   . GERD (gastroesophageal reflux disease)   . Headache(784.0)   . Afib   . ETOH abuse     Medications:  Infusions:  . heparin 850 Units/hr (10/20/12 1847)    Assessment: 67 YOF started on heparin gtt for elevated troponin and afib, she was on dabigatran prior to admission, last dose 9/12 22:30.    Initial heparin level < 0.1 on 700 units/hr (started at 10:30am per last pradaxa dosing)  Heparin level = 0.17 after rate increased to 850 units/hr  No complications of therapy noted; CBC stable  Goal of Therapy:  Heparin level 0.3-0.7 units/ml Monitor platelets by anticoagulation protocol: Yes   Plan:   Increase heparin heparin to 1000 units/hr  Check  Heparin level 8 hr after rate increase  Daily CBC, heparin level  Terrilee Files, PharmD  10/21/2012,3:30 AM

## 2012-10-21 NOTE — Progress Notes (Signed)
ANTICOAGULATION CONSULT NOTE -Follow Up Consult  Pharmacy Consult for Heparin Indication: Elevated troponin x2, afib   Allergies  Allergen Reactions  . Other Anaphylaxis    Any "cillins"  . Penicillins Anaphylaxis    Patient Measurements: Height: 5\' 7"  (170.2 cm) Weight: 109 lb 9.1 oz (49.7 kg) IBW/kg (Calculated) : 61.6    Vital Signs: Temp: 98 F (36.7 C) (09/14 1226) Temp src: Oral (09/14 1226) BP: 126/75 mmHg (09/14 1226) Pulse Rate: 84 (09/14 1226)  Labs:  Recent Labs  10/19/12 0400 10/19/12 1740 10/19/12 2350 10/20/12 0130 10/20/12 0322 10/20/12 0731  10/20/12 1755 10/21/12 0131 10/21/12 1155 10/21/12 1752  HGB 10.2*  --   --   --  11.7*  --   --   --  12.6  --   --   HCT 29.3*  --   --   --  34.2*  --   --   --  37.1  --   --   PLT 299  --   --   --  327  --   --   --  390  --   --   APTT  --   --   --  60*  --  51*  --   --   --   --   --   HEPARINUNFRC  --   --   --   --   --   --   < > <0.10* 0.17* 0.30 0.20*  CREATININE 0.43*  --   --   --  0.42*  --   --  0.52 0.57  --   --   TROPONINI  --  0.86* 1.57*  --   --  1.35*  --   --   --   --   --   < > = values in this interval not displayed.  Estimated Creatinine Clearance: 52.1 ml/min (by C-G formula based on Cr of 0.57).   Medical History: Past Medical History  Diagnosis Date  . COPD (chronic obstructive pulmonary disease)   . Depression   . Anxiety   . Shortness of breath   . Pneumonia   . Arthritis   . GERD (gastroesophageal reflux disease)   . Headache(784.0)   . Afib   . ETOH abuse     Medications:  Infusions:  . heparin 1,000 Units/hr (10/21/12 0422)    Assessment: 67 YOF started on heparin gtt for elevated troponin and afib, she was on dabigatran prior to admission, last dose 9/12 22:30.    Confirmatory heparin level = 0.2 on 1000 units/hr , no issues with infusion per RN  No complications of therapy noted; CBC stable  Goal of Therapy:  Heparin level 0.3-0.7  units/ml Monitor platelets by anticoagulation protocol: Yes   Plan:   Increase heparin rate to 1150 units/hr as heparin level subtherapeutic  Cardiology progress note states that elevated troponin d/t demand ischemia and questions if good candidate for anticoagulation d/t EtOH abuse. Spoke with [facility], continue heparin for now as cardiology did not perform full assessment today as patient off floor  Check Heparin level with am labs  Daily CBC, heparin level  Juliette Alcide, PharmD, BCPS.   Pager: [phone]  10/21/2012,6:36 PM

## 2012-10-21 NOTE — Progress Notes (Signed)
Patient ID: Alexis Cuevas, female   DOB: 1945-12-27, 67 y.o.   MRN: [id number]   SUBJECTIVE:  2-D echo reveals EF of 35-40%. There is mild dilatation the right ventricle and mild right ventricular dysfunction.   Filed Vitals:   10/21/12 0500 10/21/12 0600 10/21/12 0700 10/21/12 0800  BP:  132/90  141/91  Pulse: 102 89 88 95  Temp:    97.5 F (36.4 C)  TempSrc:    Oral  Resp: 25   25  Height:      Weight:      SpO2: 99% 96% 96% 99%    Intake/Output Summary (Last 24 hours) at 10/21/12 0829 Last data filed at 10/21/12 0745  Gross per 24 hour  Intake 1446.7 ml  Output   1600 ml  Net -153.3 ml    LABS: Basic Metabolic Panel:  Recent Labs  91/47/82 0322 10/20/12 1755 10/21/12 0131  NA 131* 135 135  K 2.8* 3.9 4.3  CL 90* 93* 94*  CO2 33* 30 32  GLUCOSE 240* 270* 184*  BUN 11 17 17   CREATININE 0.42* 0.52 0.57  CALCIUM 8.2* 8.2* 8.1*  MG 1.7  --  2.1  PHOS 2.9  --  1.7*   Liver Function Tests: No results found for this basename: AST, ALT, ALKPHOS, BILITOT, PROT, ALBUMIN,  in the last 72 hours No results found for this basename: LIPASE, AMYLASE,  in the last 72 hours CBC:  Recent Labs  10/20/12 0322 10/21/12 0131  WBC 13.5* 15.7*  NEUTROABS 12.4* 14.5*  HGB 11.7* 12.6  HCT 34.2* 37.1  MCV 88.8 89.6  PLT 327 390   Cardiac Enzymes:  Recent Labs  10/19/12 1740 10/19/12 2350 10/20/12 0731  TROPONINI 0.86* 1.57* 1.35*   BNP: No components found with this basename: POCBNP,  D-Dimer: No results found for this basename: DDIMER,  in the last 72 hours Hemoglobin A1C: No results found for this basename: HGBA1C,  in the last 72 hours Fasting Lipid Panel: No results found for this basename: CHOL, HDL, LDLCALC, TRIG, CHOLHDL, LDLDIRECT,  in the last 72 hours Thyroid Function Tests: No results found for this basename: TSH, T4TOTAL, FREET3, T3FREE, THYROIDAB,  in the last 72 hours  RADIOLOGY: Dg Chest 2 View  10/21/2012   *RADIOLOGY REPORT*  Clinical Data:  Follow-up infiltrates, cough, pneumonia  CHEST - 2 VIEW  Comparison: 10/19/2012  Findings: Heart size and vascular pattern normal.  Hyperinflation consistent with COPD.  Small bilateral pleural effusions.  Diffuse interstitial infiltrates have nearly completely resolved.  Nodular density over the left lung base appears to represent a probable nipple shadow.  IMPRESSION: Significant improvement in bilateral interstitial infiltrates consistent with resolving pulmonary edema superimposed on COPD. Probable left base nipple shadow.   Original Report Authenticated By: Esperanza Heir, M.D.   Dg Chest Port 1 View  10/19/2012   *RADIOLOGY REPORT*  Clinical Data: Cough and congestion.   Smoker for 60 years.  PORTABLE CHEST - 1 VIEW  Comparison: None.  Findings: Diffuse increased lung markings.  Some of findings may be chronic in origin but difficult to assess without comparison. There may be superimposed pulmonary edema or possibly infectious infiltrate in the proper clinical setting.  Slight patchiness of lung changes limit detection of underlying mass.  Stability and/or clearing will need to be confirmed on follow-up.  Heart size within normal limits.  Calcified slightly tortuous aorta.  No gross pneumothorax.  Blunting right costophrenic angle may represent presence of small effusion.  IMPRESSION: Diffuse increased  lung markings may represent pulmonary edema superimposed on chronic changes.  Follow up recommended as noted above.   Original Report Authenticated By: Lacy Duverney, M.D.    Telemetry   I reviewed to telemetry history. There is normal sinus rhythm.  ASSESSMENT AND PLAN:    Acute encephalopathy    ETOH abuse    Alcohol withdrawal      COPD with acute exacerbation   Acute respiratory failure with hypoxia    Elevated troponin      The elevated troponin is related to demand ischemia.    H/O amiodarone therapy     This is being continued.    Acute systolic CHF (congestive heart failure)     Atrial fibrillation     There is history of paroxysmal atrial fibrillation. The patient had been on anticoagulation. This is on hold at this point. She does not appear to be a good candidate for long-term anticoagulation with her alcohol history.    HX: anticoagulation    Patient does not appear to be a good candidate for ongoing anticoagulation with her alcohol history.  Currently the patient is off the floor and CT scanning.    Willa Rough 10/21/2012 8:29 AM

## 2012-10-21 NOTE — Progress Notes (Addendum)
ANTICOAGULATION CONSULT NOTE -Follow Up Consult  Pharmacy Consult for Heparin Indication: Elevated troponin x2    Allergies  Allergen Reactions  . Other Anaphylaxis    Any "cillins"  . Penicillins Anaphylaxis    Patient Measurements: Height: 5\' 7"  (170.2 cm) Weight: 109 lb 9.1 oz (49.7 kg) IBW/kg (Calculated) : 61.6    Vital Signs: Temp: 98 F (36.7 C) (09/14 1226) Temp src: Oral (09/14 1226) BP: 126/75 mmHg (09/14 1226) Pulse Rate: 84 (09/14 1226)  Labs:  Recent Labs  10/19/12 0400 10/19/12 1740 10/19/12 2350 10/20/12 0130 10/20/12 0322 10/20/12 0731 10/20/12 1755 10/21/12 0131 10/21/12 1155  HGB 10.2*  --   --   --  11.7*  --   --  12.6  --   HCT 29.3*  --   --   --  34.2*  --   --  37.1  --   PLT 299  --   --   --  327  --   --  390  --   APTT  --   --   --  60*  --  51*  --   --   --   HEPARINUNFRC  --   --   --   --   --   --  <0.10* 0.17* 0.30  CREATININE 0.43*  --   --   --  0.42*  --  0.52 0.57  --   TROPONINI  --  0.86* 1.57*  --   --  1.35*  --   --   --     Estimated Creatinine Clearance: 52.1 ml/min (by C-G formula based on Cr of 0.57).   Medical History: Past Medical History  Diagnosis Date  . COPD (chronic obstructive pulmonary disease)   . Depression   . Anxiety   . Shortness of breath   . Pneumonia   . Arthritis   . GERD (gastroesophageal reflux disease)   . Headache(784.0)   . Afib   . ETOH abuse     Medications:  Infusions:  . heparin 1,000 Units/hr (10/21/12 0422)    Assessment: 67 YOF started on heparin gtt for elevated troponin and afib, she was on dabigatran prior to admission, last dose 9/12 22:30.    heparin level = 0.3 on 1000 units/hr   Heparin level = 0.17 after rate increased to 850 units/hr  No complications of therapy noted; CBC stable  Goal of Therapy:  Heparin level 0.3-0.7 units/ml Monitor platelets by anticoagulation protocol: Yes   Plan:   Continue heparin at 1000 units/hr - heparin level at  low-end therapeutic range  Cardiology progress note states that elevated troponin d/t demand ischemia and questions if good candidate for anticoagulation d/t EtOH abuse   Check Heparin level ~6h to confirm therapeutic heparin level  Daily CBC, heparin level  Juliette Alcide, PharmD, BCPS.   Pager: [phone]  10/21/2012,1:06 PM

## 2012-10-22 ENCOUNTER — Other Ambulatory Visit: Payer: Self-pay

## 2012-10-22 LAB — BASIC METABOLIC PANEL
GFR calc Af Amer: >90 mL/min (ref >90)
GFR calc non Af Amer: >90 mL/min (ref >90)
Glucose, Bld: 130 mg/dL — ABNORMAL HIGH (ref 70–99)
Potassium: 4.1 mEq/L (ref 3.5–5.1)
Sodium: 134 mEq/L — ABNORMAL LOW (ref 135–145)

## 2012-10-22 LAB — CBC
Hemoglobin: 11.7 g/dL — ABNORMAL LOW (ref 12.0–15.0)
RBC: 3.93 MIL/uL (ref 3.87–5.11)

## 2012-10-22 LAB — HEPARIN LEVEL (UNFRACTIONATED)
Heparin Unfractionated: 0.2 IU/mL — ABNORMAL LOW (ref 0.30–0.70)
Heparin Unfractionated: 0.47 IU/mL (ref 0.30–0.70)

## 2012-10-22 MED ORDER — IPRATROPIUM BROMIDE 0.02 % IN SOLN
0.5000 mg | Freq: Two times a day (BID) | RESPIRATORY_TRACT | Status: DC
  Administered 2012-10-22 – 2012-10-23 (×2): 0.5 mg via RESPIRATORY_TRACT
  Filled 2012-10-22 (×2): qty 2.5

## 2012-10-22 MED ORDER — ALBUTEROL SULFATE (5 MG/ML) 0.5% IN NEBU
2.5000 mg | INHALATION_SOLUTION | Freq: Two times a day (BID) | RESPIRATORY_TRACT | Status: DC
  Administered 2012-10-22 – 2012-10-23 (×2): 2.5 mg via RESPIRATORY_TRACT
  Filled 2012-10-22 (×2): qty 0.5

## 2012-10-22 MED ORDER — FUROSEMIDE 10 MG/ML IJ SOLN
20.0000 mg | Freq: Every day | INTRAMUSCULAR | Status: DC
  Administered 2012-10-22 – 2012-10-23 (×2): 20 mg via INTRAVENOUS
  Filled 2012-10-22: qty 2

## 2012-10-22 MED ORDER — LISINOPRIL 5 MG PO TABS
5.0000 mg | ORAL_TABLET | Freq: Every day | ORAL | Status: DC
  Administered 2012-10-22 – 2012-10-23 (×2): 5 mg via ORAL
  Filled 2012-10-22 (×2): qty 1

## 2012-10-22 MED ORDER — HEPARIN (PORCINE) IN NACL 100-0.45 UNIT/ML-% IJ SOLN
1300.0000 [IU]/h | INTRAMUSCULAR | Status: DC
  Administered 2012-10-22: 1300 [IU]/h via INTRAVENOUS
  Filled 2012-10-22: qty 250

## 2012-10-22 MED ORDER — DABIGATRAN ETEXILATE MESYLATE 150 MG PO CAPS
150.0000 mg | ORAL_CAPSULE | Freq: Once | ORAL | Status: AC
  Administered 2012-10-22: 16:00:00 150 mg via ORAL
  Filled 2012-10-22: qty 1

## 2012-10-22 MED ORDER — DABIGATRAN ETEXILATE MESYLATE 150 MG PO CAPS
150.0000 mg | ORAL_CAPSULE | Freq: Two times a day (BID) | ORAL | Status: DC
  Administered 2012-10-23: 150 mg via ORAL
  Filled 2012-10-22 (×3): qty 1

## 2012-10-22 NOTE — Progress Notes (Signed)
[facility] PROGRESS NOTE  Alexis Cuevas ZOX:[id number] DOB: [birth year] DOA: 10/18/2012 PCP: No primary provider on file.  Assessment/Plan: Acute Encephalopathy -Presumed metabolic from PNA/UTI, or from ETOH withdrawals.  -She is almost back at baseline per family reports.  UTI -Reported cultures from outside hospital with pansensitive E coli. -Has completed course of antibiotics.  PNA/Lung Infiltrates/?Lung Mass -Repeat CT shows improving infiltrates most c/w pulmonary edema. -No evidence of a lung mass. -Would recommend repeating chest CT in 3 months.  Acute Combined CHF -ECHO with EF 35-40% and diastolic dysfunction. -Is 3.6 L negative. -Will place on lasix 20 daily.  Atrial Fibrillation -Rate controlled. -Was on Pradaxa prior to admission. -Cards believes she is a high risk for bleeding (and she is, given her h/o alcoholism); however, she is relatively young, at baseline is very active and has been tolerating Pradaxa without issues, so I think it is fair to give her a shot at anticoagulation.   COPD with Acute Exacerbation -Steroids have been discontinued. -She is chronically oxygen dependant.   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 -Decision was made to treat her medically with Pradaxa.  ETOH Abuse -Continue thiamine. -Do not believe she is at risk for withdrawals at this point as she has been hospitalized for 7 days now. -Will ask SW to provide patient with OP resources.  Elevated Troponin -Cards believes related to demand ischemia. -No further cardiac work up will be done this hospitalization. -Will DC heparin drip and place back on Pradaxa.  Tobacco Abuse -Nicotine patch. -Counseled on cessation.  DVT prophylaxis -On pradaxa.  Code Status: Full code Family Communication: No family at bedside. Discussed with daughter Alexis Cuevas via telephone. Disposition Plan: Home when medically stable. Likely 24-48  hours.   Consultants:  Pulmonary  Cardiology   Antibiotics:  None  Subjective: No complaints. Oriented.  Objective: Filed Vitals:   10/21/12 1932 10/21/12 2054 10/22/12 0457 10/22/12 0828  BP:  111/66 112/69   Pulse:  88 88   Temp:  97.7 F (36.5 C) 97.8 F (36.6 C)   TempSrc:  Oral Oral   Resp:  16 16   Height:      Weight:      SpO2: 99% 94% 97% 97%    Intake/Output Summary (Last 24 hours) at 10/22/12 1006 Last data filed at 10/22/12 0846  Gross per 24 hour  Intake  456.5 ml  Output   2000 ml  Net -1543.5 ml   Filed Weights   10/19/12 1800 10/20/12 0340 10/21/12 0400  Weight: 50.8 kg (111 lb 15.9 oz) 49.1 kg (108 lb 3.9 oz) 49.7 kg (109 lb 9.1 oz)    Exam:   General:  AA Ox3, NAD  Cardiovascular: RRR, no M/R/G  Respiratory: Mild bilateral crackles  Abdomen: S/NT/ND/+BS  Extremities: no C/C/E   Neurologic:  Non-focal  Data Reviewed: Basic Metabolic Panel:  Recent Labs Lab 10/19/12 0400 10/20/12 0322 10/20/12 1755 10/21/12 0131 10/22/12 0350  NA 135 131* 135 135 134*  K 3.8 2.8* 3.9 4.3 4.1  CL 100 90* 93* 94* 94*  CO2 28 33* 30 32 34*  GLUCOSE 158* 240* 270* 184* 130*  BUN 11 11 17 17 19   CREATININE 0.43* 0.42* 0.52 0.57 0.56  CALCIUM 8.1* 8.2* 8.2* 8.1* 8.4  MG  --  1.7  --  2.1  --   PHOS  --  2.9  --  1.7*  --    Liver Function Tests: No results found for this basename: AST, ALT, ALKPHOS,  BILITOT, PROT, ALBUMIN,  in the last 168 hours No results found for this basename: LIPASE, AMYLASE,  in the last 168 hours No results found for this basename: AMMONIA,  in the last 168 hours CBC:  Recent Labs Lab 10/18/12 2217 10/19/12 0400 10/20/12 0322 10/21/12 0131 10/22/12 0350  WBC 13.5* 12.0* 13.5* 15.7* 12.2*  NEUTROABS  --  11.2* 12.4* 14.5*  --   HGB 10.4* 10.2* 11.7* 12.6 11.7*  HCT 30.1* 29.3* 34.2* 37.1 35.6*  MCV 88.5 89.3 88.8 89.6 90.6  PLT 294 299 327 390 390   Cardiac Enzymes:  Recent Labs Lab 10/19/12 1740  10/19/12 2350 10/20/12 0731  TROPONINI 0.86* 1.57* 1.35*   BNP (last 3 results)  Recent Labs  10/19/12 1741 10/20/12 0322  PROBNP 11944.0* 23489.0*   CBG: No results found for this basename: GLUCAP,  in the last 168 hours  Recent Results (from the past 240 hour(s))  MRSA PCR SCREENING     Status: None   Collection Time    10/19/12  5:53 PM      Result Value Range Status   MRSA by PCR NEGATIVE  NEGATIVE Final   Comment:            The GeneXpert MRSA Assay (FDA     approved for NASAL specimens     only), is one component of a     comprehensive MRSA colonization     surveillance program. It is not     intended to diagnose MRSA     infection nor to guide or     monitor treatment for     MRSA infections.     Studies: Dg Chest 2 View  10/21/2012   *RADIOLOGY REPORT*  Clinical Data: Follow-up infiltrates, cough, pneumonia  CHEST - 2 VIEW  Comparison: 10/19/2012  Findings: Heart size and vascular pattern normal.  Hyperinflation consistent with COPD.  Small bilateral pleural effusions.  Diffuse interstitial infiltrates have nearly completely resolved.  Nodular density over the left lung base appears to represent a probable nipple shadow.  IMPRESSION: Significant improvement in bilateral interstitial infiltrates consistent with resolving pulmonary edema superimposed on COPD. Probable left base nipple shadow.   Original Report Authenticated By: Esperanza Heir, M.D.   Ct Chest W Contrast  10/21/2012   *RADIOLOGY REPORT*  Clinical Data: Evaluate infiltrates right upper lobe density  CT CHEST WITH CONTRAST  Technique:  Multidetector CT imaging of the chest was performed following the standard protocol during bolus administration of intravenous contrast.  Contrast: 80mL OMNIPAQUE IOHEXOL 300 MG/ML  SOLN  Comparison: Chest x-ray obtained earlier today at 07:54 a.m.  Findings:  Mediastinum: Prominence, borderline enlarged by CT criteria mediastinal lymph nodes.  Index right paratracheal node  measures 12.5 mm in short axis.  The prevascular node measures 9 mm in short axis.  Hilar lymphoid tissue is mildly prominent.  Heart/Vascular: Conventional three-vessel arch anatomy. Atherosclerotic calcifications are noted at the origin of the great vessels without significant appearing stenosis.  No aortic aneurysmal dilatation or dissection.  The heart is within normal limits for size.  No pericardial effusion.  Atherosclerotic calcifications noted throughout the coronary arteries including the left main coronary artery.  There is calcification of the mitral valve annulus and aortic valve leaflets.  No large central pulmonary embolus.  Lungs/Pleura: Moderate bilateral layering pleural effusions slightly larger on the right than the left.  Interlobular septal thickening with patchy areas of ground-glass attenuation opacity in the bilateral lungs is most suggestive of mild interstitial  and alveolar pulmonary edema.  There is a background of centrilobular emphysema and diffuse mild bronchial wall thickening suggesting underlying COPD.  Linear atelectasis versus scarring noted within the right middle lobe.  There is compressive atelectasis in the bilateral lower lobes secondary to the presence of the pleural effusions. Several scattered small pulmonary nodules are noted including a 2 mm nodule in the periphery of the right upper lobe (image 21, series 5) and a small cluster of nodules in the posterior aspect of the superior segment of the right lower lobe with a maximal size of 3 mm (image 33, series 5).  Upper Abdomen: Surgical changes of prior cholecystectomy.  Mild prominence of the extrahepatic biliary duct without significant dilatation.  Scattered atherosclerotic vascular calcifications. Otherwise, limited imaging of the upper abdomen is unremarkable.  Bones: No acute fracture or aggressive appearing lytic or blastic osseous lesion.  IMPRESSION:  1.  CT findings are most consistent with mild interstitial and  alveolar edema.  2.  Bilateral moderate pleural effusions slightly larger on the right than the left with associated lower lobe atelectasis.  3.  Centrilobular emphysema  4.  Pulmonary hyperexpansion and diffuse bronchial wall thickening suggests underlying chronic bronchitis/COPD  5.  Atherosclerosis including coronary artery disease.  Calcified coronary artery plaque is noted in the left main coronary artery.  6.  Scattered 2 - 3 mm pulmonary nodules likely reflect an underlying infectious/inflammatory or granulomatous process.  7.  Nonspecific borderline mediastinal adenopathy may be reactive or related to chronic recurrent congestive heart failure.  Consider repeat chest CT in 3 - 6 months to assess for the stability of the small pulmonary nodules and borderline mediastinal adenopathy.   Original Report Authenticated By: Malachy Moan, M.D.    Scheduled Meds: . ipratropium  0.5 mg Nebulization Q6H   And  . albuterol  2.5 mg Nebulization Q6H  . amiodarone  200 mg Oral Daily  . antiseptic oral rinse  15 mL Mouth Rinse BID  . diltiazem  240 mg Oral Daily  . feeding supplement  237 mL Oral BID BM  . folic acid  1 mg Oral Daily  . multivitamin with minerals  1 tablet Oral Daily  . nicotine  21 mg Transdermal Q24H  . sertraline  150 mg Oral Daily  . thiamine  100 mg Oral Daily   Or  . thiamine  100 mg Intravenous Daily   Continuous Infusions: . heparin 1,300 Units/hr (10/22/12 6045)    Principal Problem:   Acute encephalopathy Active Problems:   Acute on chronic combined systolic and diastolic CHF (congestive heart failure)   ETOH abuse   UTI (urinary tract infection)   PNA (pneumonia)   Alcohol withdrawal   Carotid stenosis   COPD with acute exacerbation   Acute respiratory failure with hypoxia   Elevated troponin   H/O amiodarone therapy   Atrial fibrillation   HX: anticoagulation    Time spent: 35 minutes    Alexis Cuevas,Alexis Cuevas  [facility] Pager  [phone]  If 7PM-7AM, please contact night-coverage at [email or web address], password [facility] 10/22/2012, 10:06 AM  LOS: 4 days

## 2012-10-22 NOTE — Progress Notes (Signed)
Physical Therapy Treatment Patient Details Name: Alexis Cuevas MRN: [id number] DOB: [birth year] Today's Date: 10/22/2012 Time: 4332-9518 PT Time Calculation (min): 25 min  PT Assessment / Plan / Recommendation  History of Present Illness This is a 67yo female with a history of afib/ETOH abuse,COPD who was transferred from [facility] on 9/11 per family request to be treated for acute delirium, possible PNA and RUL mass.  She apparently has o2 dependent COPD, and chronic ETOH use and was admitted with altered mental status and was found to have a UTI.  SHe was found on chest xray to have a new lung mass.  She went through ETOH withdrawal while hospitalized.  She was started on Levaquin for UTI and PNA.  On admission at [facility] she was found to have an elevated troponin and Cardiology is now asked to consult.  She denies any history of chest pain and no LE edema.  She has a history of atrial fibrillation and is on amiodarone.   PT Comments   Pt in bed on 2 lts nasal at 96%.  Amb in hallway twice with one sitting rest break.  Avg sats during amb 94%.  Pt demon limited activity tolerance and requires freq rest breaks.    Follow Up Recommendations  Home health PT     Does the patient have the potential to tolerate intense rehabilitation     Barriers to Discharge        Equipment Recommendations  None recommended by PT    Recommendations for Other Services    Frequency     Progress towards PT Goals Progress towards PT goals: Progressing toward goals  Plan      Precautions / Restrictions Precautions Precautions: Fall Precaution Comments: home O2 Restrictions Weight Bearing Restrictions: No    Pertinent Vitals/Pain No c/o pain    Mobility  Bed Mobility Bed Mobility: Supine to Sit;Sit to Supine Supine to Sit: 4: Min assist Sit to Supine: 4: Min assist Details for Bed Mobility Assistance: increased time Transfers Transfers: Sit to Stand;Stand to Sit Sit to Stand: 4: Min  assist;From bed Stand to Sit: 4: Min assist;To bed Details for Transfer Assistance: mild unsteady Ambulation/Gait Ambulation/Gait Assistance: 4: Min assist Ambulation Distance (Feet): 120 Feet (60 feet x 2 one sitting rest break) Ambulation/Gait Assistance Details: "I don't use a walker at home". Amb HHA min assist with mild unsteadyness which increase with increased fatigue and coughing episodes. 2nd assist needed for IV and O2 tank.  When istructed on purse lip breathing, pt replied "that just makes me more dizzy".  Gait Pattern: Step-through pattern;Narrow base of support Gait velocity: too quick, VC's to decrease gait speed.  Avg O2 sats with 2 lts 94%.     PT Goals (current goals can now be found in the care plan section)    Visit Information  Last PT Received On: 10/22/12 Assistance Needed: +1 History of Present Illness: This is a 67yo female with a history of afib/ETOH abuse,COPD who was transferred from [facility] on 9/11 per family request to be treated for acute delirium, possible PNA and RUL mass.  She apparently has o2 dependent COPD, and chronic ETOH use and was admitted with altered mental status and was found to have a UTI.  SHe was found on chest xray to have a new lung mass.  She went through ETOH withdrawal while hospitalized.  She was started on Levaquin for UTI and PNA.  On admission at [facility] she was found to have  an elevated troponin and Cardiology is now asked to consult.  She denies any history of chest pain and no LE edema.  She has a history of atrial fibrillation and is on amiodarone.    Subjective Data      Cognition       Balance     End of Session PT - End of Session Equipment Utilized During Treatment: Gait belt Activity Tolerance: Patient tolerated treatment well Patient left: in bed;with call bell/phone within reach   Felecia Shelling  PTA [facility]  Acute  Rehab Pager      [phone]

## 2012-10-22 NOTE — Progress Notes (Signed)
ANTICOAGULATION CONSULT NOTE -Follow Up Consult  Pharmacy Consult for Heparin Indication: Elevated troponin x2, afib   Allergies  Allergen Reactions  . Other Anaphylaxis    Any "cillins"  . Penicillins Anaphylaxis    Patient Measurements: Height: 5\' 7"  (170.2 cm) Weight: 109 lb 9.1 oz (49.7 kg) IBW/kg (Calculated) : 61.6    Vital Signs: Temp: 97.8 F (36.6 C) (09/15 0457) Temp src: Oral (09/15 0457) BP: 112/69 mmHg (09/15 0457) Pulse Rate: 88 (09/15 0457)  Labs:  Recent Labs  10/19/12 1740 10/19/12 2350 10/20/12 0130  10/20/12 0322 10/20/12 0731  10/20/12 1755 10/21/12 0131 10/21/12 1155 10/21/12 1752 10/22/12 0350  HGB  --   --   --   < > 11.7*  --   --   --  12.6  --   --  11.7*  HCT  --   --   --   --  34.2*  --   --   --  37.1  --   --  35.6*  PLT  --   --   --   --  327  --   --   --  390  --   --  390  APTT  --   --  60*  --   --  51*  --   --   --   --   --   --   HEPARINUNFRC  --   --   --   --   --   --   < > <0.10* 0.17* 0.30 0.20* 0.20*  CREATININE  --   --   --   < > 0.42*  --   --  0.52 0.57  --   --  0.56  TROPONINI 0.86* 1.57*  --   --   --  1.35*  --   --   --   --   --   --   < > = values in this interval not displayed.  Estimated Creatinine Clearance: 52.1 ml/min (by C-G formula based on Cr of 0.56).   Medical History: Past Medical History  Diagnosis Date  . COPD (chronic obstructive pulmonary disease)   . Depression   . Anxiety   . Shortness of breath   . Pneumonia   . Arthritis   . GERD (gastroesophageal reflux disease)   . Headache(784.0)   . Afib   . ETOH abuse     Medications:  Infusions:  . heparin 1,150 Units/hr (10/21/12 1900)    Assessment: 67 YOF started on heparin gtt for elevated troponin and afib, she was on dabigatran prior to admission, last dose 9/12 22:30.    Heparin level remains 0.2 on 1150 units/hr, despite rate increase from 1000 units/hr   No complications of therapy noted; CBC OK  Goal of Therapy:   Heparin level 0.3-0.7 units/ml Monitor platelets by anticoagulation protocol: Yes   Plan:   Increase heparin rate to 1300 units/hr as heparin level subtherapeutic  Check heparin level in 8 hrs after rate change  Check Heparin level with am labs  Daily CBC, heparin level  Terrilee Files, PharmD   10/22/2012,5:52 AM

## 2012-10-22 NOTE — Progress Notes (Signed)
10/22/12 1600  Clinical Encounter Type  Visited With Patient  Visit Type Initial;Spiritual support;Social support  Referral From Physician (consult for support re dx lung mass)  Spiritual Encounters  Spiritual Needs Emotional  Stress Factors  Patient Stress Factors Health changes   Thank you for consult.  Alexis Cuevas feels some anxiety at the multiple layers of health concerns (per pt:  heart issues, lung nodules, etc), as well as coordinating with family and doctors to receive more information about her plan of care.  She expects her daughter Alexis Cuevas, Charity fundraiser at [employer], to arrive shortly for emotional and liaison support.  [name], family (husband, children, grands, and great-grands) is a source of ultimate meaning and joy in life.  Provided pastoral presence, reflective listening, and prayer at bedside per pt request.  Please page [phone] if further support needed.  [address] [place], [place] [phone]

## 2012-10-22 NOTE — Clinical Social Work Psychosocial (Signed)
     Clinical Social Work Department BRIEF PSYCHOSOCIAL ASSESSMENT 10/22/2012  Patient:  Alexis, Cuevas     Account Number:  [id number]     Admit date:  10/18/2012  Clinical Social Worker:  Hattie Perch  Date/Time:  10/22/2012 12:00 M  Referred by:  Physician  Date Referred:  10/22/2012 Referred for  Substance Abuse   Other Referral:   Interview type:  Patient Other interview type:    PSYCHOSOCIAL DATA Living Status:  FAMILY Admitted from facility:   Level of care:   Primary support name:  Alexis Cuevas Primary support relationship to patient:  SPOUSE Degree of support available:   good    CURRENT CONCERNS Current Concerns  Substance Abuse   Other Concerns:    SOCIAL WORK ASSESSMENT / PLAN CSW met with patient at request of MD to give ETOH resources. patient states she drinks two beers a night so she does not understand why anyone thinks she has an alcohol problem. CSW discussed outpatient resources should patient ever be concerned with her alcohol usage or have difficulty not stopping her two beers a night if it becomes necessary. patient accepted resources.   Assessment/plan status:   Other assessment/ plan:   Information/referral to community resources:    PATIENTS/FAMILYS RESPONSE TO PLAN OF CARE: patient not very receptive to the fact that she may have an alcohol dependency issue but was pleasant and cooperative and accepted outpatient resources from CSW.

## 2012-10-22 NOTE — Progress Notes (Signed)
Patient Name: Alexis Cuevas Date of Encounter: 10/22/2012    SUBJECTIVE: Voices no cardiac complaints. Breathing has improved.  TELEMETRY:  Normal sinus rhythm: Filed Vitals:   10/21/12 1932 10/21/12 2054 10/22/12 0457 10/22/12 0828  BP:  111/66 112/69   Pulse:  88 88   Temp:  97.7 F (36.5 C) 97.8 F (36.6 C)   TempSrc:  Oral Oral   Resp:  16 16   Height:      Weight:      SpO2: 99% 94% 97% 97%    Intake/Output Summary (Last 24 hours) at 10/22/12 1048 Last data filed at 10/22/12 0846  Gross per 24 hour  Intake  456.5 ml  Output   2000 ml  Net -1543.5 ml    LABS: Basic Metabolic Panel:  Recent Labs  16/10/96 0322  10/21/12 0131 10/22/12 0350  NA 131*  < > 135 134*  K 2.8*  < > 4.3 4.1  CL 90*  < > 94* 94*  CO2 33*  < > 32 34*  GLUCOSE 240*  < > 184* 130*  BUN 11  < > 17 19  CREATININE 0.42*  < > 0.57 0.56  CALCIUM 8.2*  < > 8.1* 8.4  MG 1.7  --  2.1  --   PHOS 2.9  --  1.7*  --   < > = values in this interval not displayed. CBC:  Recent Labs  10/20/12 0322 10/21/12 0131 10/22/12 0350  WBC 13.5* 15.7* 12.2*  NEUTROABS 12.4* 14.5*  --   HGB 11.7* 12.6 11.7*  HCT 34.2* 37.1 35.6*  MCV 88.8 89.6 90.6  PLT 327 390 390   Cardiac Enzymes:  Recent Labs  10/19/12 1740 10/19/12 2350 10/20/12 0731  TROPONINI 0.86* 1.57* 1.35*   BNP    Component Value Date/Time   PROBNP 23489.0* 10/20/2012 0322   ECHOCARDIOGRAM: 10/20/12 Study Conclusions  - Left ventricle: There is no old study to compare. Severe hypokinesis of the septum. The EF is 35-40%. The cavity size was normal. Wall thickness was increased in a pattern of mild LVH. - Aortic valve: Sclerosis without stenosis. No regurgitation. - Mitral valve: Leaflets difficult to see in some views. But no definite MS. Moderately calcified annulus. Mild regurgitation. - Left atrium: The atrium was mildly to moderately dilated. - Right ventricle: The cavity size was mildly dilated. Systolic function  was mildly reduced. - Right atrium: The atrium was mildly dilated.   Radiology/Studies:   10/21/12  IMPRESSION:  Significant improvement in bilateral interstitial infiltrates  consistent with resolving pulmonary edema superimposed on COPD.  Probable left base nipple shadow.  Original Report Authenticated By: Esperanza Heir, M.D.   Physical Exam: Blood pressure 112/69, pulse 88, temperature 97.8 F (36.6 C), temperature source Oral, resp. rate 16, height 5\' 7"  (1.702 m), weight 49.7 kg (109 lb 9.1 oz), SpO2 97.00%. Weight change:    Diminished breath sounds bilaterally do to decreased air movement.   Cardiac exam without gallop.  Mild JVD with the patient sitting  No peripheral edema.  Neurological exam reveals decreased conversation/communication but appropriate answers to questions   ASSESSMENT:  1. lung cancer  2. Acute heart failure, combined systolic and diastolic, improved with diuresis  3. 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  4. Resolution of atrial fibrillation  5. Elevated troponin most likely related to demand ischemia. We should assume that the patient have superimposed coronary artery disease, but this presentation is not one of acute coronary syndrome.  Plan:   1. I do not believe that ischemic evaluation would be fruitful given the patient's significant comorbidities including COPD (O2 dependent), alcoholism and now probable lung cancer.  2.  Continue amiodarone therapy for suppression of atrial fibrillation  3. CHADS-VASC warrants anticoagulation but patient is high risk for bleeding  4.  Would recommend Aldactone 25 mg once or twice a day to prevent recurrent volume overload.    5. COPD prevents beta blocker use  6. Would avoid the negative inotropic effect of diltiazem if possible.   Selinda Eon 10/22/2012, 10:48 AM

## 2012-10-22 NOTE — Progress Notes (Signed)
PULMONARY  / CRITICAL CARE MEDICINE  Name: Alexis Cuevas MRN: [id number] DOB: 06-Jan-1946    ADMISSION DATE:  10/18/2012 CONSULTATION DATE:  10/19/2012  REFERRING MD :  Peggye Pitt, MD PRIMARY SERVICE:  PCCM  CHIEF COMPLAINT:  Cough, dyspnea, altered mental status  BRIEF PATIENT DESCRIPTION: 67 yo female transferred from [facility] on 9/11 via ambulance per pt family request after being treated for acute delirium, possible PNA and question of RUL lung mass.   SIGNIFICANT EVENTS / STUDIES:  9/10 CT Chest @ [facility]: Emphysema, Bilateral pleural effusion, RUL 2.7cm oblong area of consolidated mass like appearance. Patchy bilateral GGOs with BUL predominance 9/12 Echo: LVEF 35-40% 9/14 CXR much improved.  9/14 CT chest: mild to mod bilateral edema/ improved. R>L effusion, boarderline adenopathy, small scattered nodules   LINES / TUBES:   CULTURES: Urine culture 9/8 >> GNRs  ANTIBIOTICS: Clinda 9/09 >> 9/11 Levoflox 9/07 >> 9/11 ---------Above abx administered prior to transfer--------- PCT 9/12: < 0.10,   9/13: < 0.10,  9/14: < 0.10 ESR 9/12: 70 mm/hr Cipro 9/12 >> 9/14   SUBJECTIVE:  Calm, NAD. RASS 0. + F/C  VITAL SIGNS: Temp:  [97.7 F (36.5 C)-97.8 F (36.6 C)] 97.8 F (36.6 C) (09/15 1414) Pulse Rate:  [88-90] 90 (09/15 1414) Resp:  [16-18] 18 (09/15 1414) BP: (98-112)/(54-69) 98/54 mmHg (09/15 1414) SpO2:  [94 %-99 %] 97 % (09/15 1414) 2 liters  PHYSICAL EXAMINATION: General:  RASS 0, NAD Neuro: no focal deficits HEENT:  WNL Neck:  No JVD noted Cardiovascular:  RRR s M Lungs:  decrease bibasilar crackles, no wheezes Abdomen:  Soft, NT/ND, +BS Ext: warm, no edema  PULMONARY  Recent Labs Lab 10/19/12 1640  PHART 7.469*  PCO2ART 37.5  PO2ART 73.3*  HCO3 26.9*  TCO2 23.2  O2SAT 94.2    CBC  Recent Labs Lab 10/20/12 0322 10/21/12 0131 10/22/12 0350  HGB 11.7* 12.6 11.7*  HCT 34.2* 37.1 35.6*  WBC  13.5* 15.7* 12.2*  PLT 327 390 390    COAGULATION No results found for this basename: INR,  in the last 168 hours  CARDIAC    Recent Labs Lab 10/19/12 1740 10/19/12 2350 10/20/12 0731  TROPONINI 0.86* 1.57* 1.35*    Recent Labs Lab 10/19/12 1741 10/20/12 0322  PROBNP 11944.0* 23489.0*     CHEMISTRY  Recent Labs Lab 10/19/12 0400 10/20/12 0322 10/20/12 1755 10/21/12 0131 10/22/12 0350  NA 135 131* 135 135 134*  K 3.8 2.8* 3.9 4.3 4.1  CL 100 90* 93* 94* 94*  CO2 28 33* 30 32 34*  GLUCOSE 158* 240* 270* 184* 130*  BUN 11 11 17 17 19   CREATININE 0.43* 0.42* 0.52 0.57 0.56  CALCIUM 8.1* 8.2* 8.2* 8.1* 8.4  MG  --  1.7  --  2.1  --   PHOS  --  2.9  --  1.7*  --    Estimated Creatinine Clearance: 52.1 ml/min (by C-G formula based on Cr of 0.56).   LIVER No results found for this basename: AST, ALT, ALKPHOS, BILITOT, PROT, ALBUMIN, INR,  in the last 168 hours   INFECTIOUS  Recent Labs Lab 10/19/12 2254 10/20/12 0322 10/21/12 0132  LATICACIDVEN  --  1.3  --   PROCALCITON <0.10 <0.10 <0.10     ENDOCRINE CBG (last 3)  No results found for this basename: GLUCAP,  in the last 72 hours   CXR/CT chest : improved interstitial infiltrates. 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  Respiratory status improved.  Likely CHF exacerbation in setting of COPD.  She has outpt follow up in [place].  PCCM will sign off.  Please call if additional assistance is needed while pt is in hospital.  Coralyn Helling, MD [facility] Pulmonary/Critical Care 10/22/2012, 4:21 PM Pager:  [phone] After 3pm call: [phone]

## 2012-10-22 NOTE — Progress Notes (Signed)
ANTICOAGULATION CONSULT NOTE - Follow Up Consult  Pharmacy Consult for IV heparin Indication: Elevated troponin x2, afib  Allergies  Allergen Reactions  . Other Anaphylaxis    Any "cillins"  . Penicillins Anaphylaxis    Patient Measurements: Height: 5\' 7"  (170.2 cm) Weight: 109 lb 9.1 oz (49.7 kg) IBW/kg (Calculated) : 61.6  Labs:  Recent Labs  10/19/12 1740 10/19/12 2350 10/20/12 0130  10/20/12 0322 10/20/12 0731  10/20/12 1755 10/21/12 0131  10/21/12 1752 10/22/12 0350 10/22/12 1352  HGB  --   --   --   < > 11.7*  --   --   --  12.6  --   --  11.7*  --   HCT  --   --   --   --  34.2*  --   --   --  37.1  --   --  35.6*  --   PLT  --   --   --   --  327  --   --   --  390  --   --  390  --   APTT  --   --  60*  --   --  51*  --   --   --   --   --   --   --   HEPARINUNFRC  --   --   --   --   --   --   < > <0.10* 0.17*  < > 0.20* 0.20* 0.47  CREATININE  --   --   --   < > 0.42*  --   --  0.52 0.57  --   --  0.56  --   TROPONINI 0.86* 1.57*  --   --   --  1.35*  --   --   --   --   --   --   --   < > = values in this interval not displayed.  Estimated Creatinine Clearance: 52.1 ml/min (by C-G formula based on Cr of 0.56).   Assessment: 67 YOF started on heparin gtt for elevated troponin and afib, she was on dabigatran prior to admission, last dose 9/12 at 22:30.  IV heparin currently running at 1300 units/hr and heparin level therapeutic at 0.47. No complications of therapy noted; CBC stable.   Cardiology noted ischemic evaluation likely not fruitful given significant co-morbidities and patient does not appear to be a good candidate for ongoing anticoagulation given alcohol history but want to continue IV heparin for now. High bleeding risk patient.   Goal of Therapy:  Heparin level 0.3-0.7 units/ml Monitor platelets by anticoagulation protocol: Yes   Plan:   Continue IV heparin at 1300 units/hr  Confirmatory heparin level at 2000  Daily heparin level and  CBC   Geoffry Paradise, PharmD, BCPS Pager: [phone] 2:34 PM Pharmacy #: [id number]

## 2012-10-23 MED ORDER — ADULT MULTIVITAMIN W/MINERALS CH: 1.0000 | ORAL_TABLET | Freq: Every day | ORAL | Status: AC

## 2012-10-23 MED ORDER — FUROSEMIDE 20 MG PO TABS: 20.0000 mg | ORAL_TABLET | Freq: Every day | ORAL | Status: AC

## 2012-10-23 MED ORDER — AMIODARONE HCL 200 MG PO TABS: 200.0000 mg | ORAL_TABLET | Freq: Every day | ORAL | Status: AC

## 2012-10-23 MED ORDER — LISINOPRIL 5 MG PO TABS: 5.0000 mg | ORAL_TABLET | Freq: Every day | ORAL | Status: AC

## 2012-10-23 MED ORDER — FOLIC ACID 1 MG PO TABS: 1.0000 mg | ORAL_TABLET | Freq: Every day | ORAL | Status: AC

## 2012-10-23 MED ORDER — THIAMINE HCL 100 MG PO TABS: 100.0000 mg | ORAL_TABLET | Freq: Every day | ORAL | Status: AC

## 2012-10-23 NOTE — Progress Notes (Signed)
NUTRITION FOLLOW-UP  INTERVENTION: Continue Ensure Complete po BID, each supplement provides 350 kcal and 13 grams of protein and Magic cup BID between meals, each supplement provides 290 kcal and 9 grams of protein. Agree with Regular, liberalized diet. RD to continue to follow nutrition care plan.  NUTRITION DIAGNOSIS: Inadequate oral intake related to pneumonia and lung mass as evidenced by reported intake less than estimated needs.   Goal: Pt to meet >/= 90% of their estimated nutrition needs   Monitor:  Weight, height, po intake  ASSESSMENT: Pt transferred from outside facility. 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  Work-up reveals PNA and UTI. Pt is almost back to her baseline, mentally. Continues on a Regular diet with Ensure Complete PO BID ordered. Meal intake remains minimal, consuming 25% of meals. Discussed intake with patient, she confirms that her intake has been poor for a very long time, and that 25% of meal intake is normal for her. She is drinking her Ensures, has one at bedside at this time.  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  Phosphorus and sodium are currently low.  Height: Ht Readings from Last 1 Encounters:  10/19/12 5\' 7"  (1.702 m)    Weight: Wt Readings from Last 1 Encounters:  10/23/12 107 lb 12.8 oz (48.898 kg)  Wt decreasing since admit; net -4L  BMI:  Body mass index is 16.88 kg/(m^2). Underweight  Estimated Nutritional Needs: Kcal: 1400-1550 Protein: 55-65 g Fluid: >1.6 L  Skin: WNL  Diet Order: General  EDUCATION NEEDS: -Education not appropriate at this time   Intake/Output Summary (Last 24 hours) at 10/23/12 1030 Last data filed at 10/23/12 0927  Gross per 24 hour  Intake   1014 ml  Output    2450 ml  Net  -1436 ml    Last BM: 9/14   Labs:   Recent Labs Lab 10/19/12 0400 10/20/12 0322 10/20/12 1755 10/21/12 0131 10/22/12 0350  NA 135 131* 135 135 134*  K 3.8 2.8* 3.9 4.3 4.1  CL 100 90* 93* 94* 94*  CO2 28 33* 30 32 34*  BUN 11 11 17 17 19   CREATININE 0.43* 0.42* 0.52 0.57 0.56  CALCIUM 8.1* 8.2* 8.2* 8.1* 8.4  MG  --  1.7  --  2.1  --   PHOS  --  2.9  --  1.7*  --   GLUCOSE 158* 240* 270* 184* 130*     Scheduled Meds: . albuterol  2.5 mg Nebulization BID  . amiodarone  200 mg Oral Daily  . antiseptic oral rinse  15 mL Mouth Rinse BID  . dabigatran  150 mg Oral Q12H  . diltiazem  240 mg Oral Daily  . feeding supplement  237 mL Oral BID BM  . folic acid  1 mg Oral Daily  . furosemide  20 mg Intravenous Daily  . ipratropium  0.5 mg Nebulization BID  . lisinopril  5 mg Oral Daily  . multivitamin with minerals  1 tablet Oral Daily  . nicotine  21 mg Transdermal Q24H  . sertraline  150 mg Oral Daily  . thiamine  100 mg Oral Daily   Or  . thiamine  100 mg Intravenous Daily    Continuous Infusions:   Jarold Motto MS,  RD, LDN Pager: [phone] After-hours pager: [phone]

## 2012-10-23 NOTE — Care Management Note (Addendum)
    Page 1 of 2   10/23/2012     3:43:13 PM   CARE MANAGEMENT NOTE 10/23/2012  Patient:  Alexis Cuevas, Alexis Cuevas   Account Number:  [id number]  Date Initiated:  10/19/2012  Documentation initiated by:  Alexis Cuevas  Subjective/Objective Assessment:   pt transferred from [facility] to here with PNA, possible brain mets     Action/Plan:   from [facility]   Anticipated DC Date:  10/23/2012   Anticipated DC Plan:  HOME W HOME HEALTH SERVICES      DC Planning Services  CM consult      Choice offered to / List presented to:  C-4 Adult Children        HH arranged  HH-2 PT  HH-1 RN      [facility] agency  [facility]   Status of service:  Completed, signed off Medicare Important Message given?   (If response is "NO", the following Medicare IM given date fields will be blank) Date Medicare IM given:   Date Additional Medicare IM given:    Discharge Disposition:  HOME W HOME HEALTH SERVICES  Per UR Regulation:  Reviewed for med. necessity/level of care/duration of stay  If discussed at Long Length of Stay Meetings, dates discussed:    Comments:  10/23/12 Alexis Bou RN,BSN NCM [phone] RECEIVED HHPT/RN ORDER,& F2F,FAMILY WILL BRING IN HOME 02. 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  10/19/12 MMcGibboney, RN, BSN Chart reviewed.

## 2012-10-23 NOTE — Discharge Summary (Signed)
Physician Discharge Summary  Alexis Cuevas GNF:[id number] DOB: 02-Jul-1945 DOA: 10/18/2012  PCP: No primary provider on file.  Admit date: 10/18/2012 Discharge date: 10/23/2012  Time spent: 45  minutes  Recommendations for Outpatient Follow-up:  -Discharged home today to stay with her daughter. -Will need repeat CT scan of the chest in 3 months to follow up on ??lung mass. -Advised to follow up with cardiology as well for newly diagnosed CHF.   Discharge Diagnoses:  Principal Problem:   Acute encephalopathy Active Problems:   Acute on chronic combined systolic and diastolic CHF (congestive heart failure)   ETOH abuse   UTI (urinary tract infection)   PNA (pneumonia)   Alcohol withdrawal   Carotid stenosis   COPD with acute exacerbation   Acute respiratory failure with hypoxia   Elevated troponin   H/O amiodarone therapy   Atrial fibrillation   HX: anticoagulation   Discharge Condition: Stable and Improved  Filed Weights   10/20/12 0340 10/21/12 0400 10/23/12 0446  Weight: 49.1 kg (108 lb 3.9 oz) 49.7 kg (109 lb 9.1 oz) 48.898 kg (107 lb 12.8 oz)    History of present illness:  Patient is a 67 yo female h/o afib, copd home oxygen dependent, ?etoh use chronically transferred from outside facility per family wishes. Pt was hospitalized over 48 hours ago with ams, uti. Was also dx with pna and new lung mass. Thought to have gone through some etoh withdrawal. No culture data with chart. Could not find med rec sheet either. Per records, pt has been on levaquin and urine cx grew out e coli, and possible blood cx with contaminate growth. She has also been having some mild hyponatremia last Na level approx 127. Cr function has been normal. Pt denies any pain at this time. Feels ok. Has no complaints. Says she is chronically on oxygen at home previous to admission. We were asked to admit her for further evaluation and management.   Hospital Course:   Acute Encephalopathy  -Presumed  metabolic from PNA/UTI, or from ETOH withdrawals.  -Resolved.  UTI  -Reported cultures from outside hospital with pansensitive E coli.  -Has completed course of antibiotics.   PNA/Lung Infiltrates/?Lung Mass  -Repeat CT shows improving infiltrates most c/w pulmonary edema.  -No evidence of a lung mass.  -Would recommend repeating chest CT in 3 months.   Acute Combined CHF  -ECHO with EF 35-40% and diastolic dysfunction.  -Is 4.2 L negative since admission.  -Will place on lasix 20 daily.   Atrial Fibrillation  -Rate controlled.  -On amiodarone. Cards has recommended decreasing from BID to once daily. -Was on Pradaxa prior to admission.  -Cards believes she is a high risk for falls (and she is, given her h/o alcoholism); however, she is relatively young, at baseline is very active and has been tolerating Pradaxa without issues, so I think it is fair to give her a shot at anticoagulation. Discussed with daughter, and she agrees with continuing anticoagulation.  COPD with Acute Exacerbation  -Steroids have been discontinued.  -She is chronically oxygen dependant.   Carotid Stenosis  -Per daughter's report: 70% stenosis.  -Decision was made to treat her medically with Pradaxa.   ETOH Abuse  -Continue thiamine.  -Do not believe she is at risk for withdrawals at this point as she has been hospitalized for over  7 days now.  -Will ask SW to provide patient with OP resources.   Elevated Troponin  -Cards believes related to demand ischemia.  -No further  cardiac work up will be done this hospitalization.  -Will DC heparin drip and place back on Pradaxa.   Tobacco Abuse  -Nicotine patch.  -Counseled on cessation.   DVT prophylaxis  -On pradaxa.   Procedures:  None   Consultations:  Pulmonary  Cardiology  Discharge Instructions      Discharge Orders   Future Orders Complete By Expires   Diet - low sodium heart healthy  As directed    Discontinue IV  As directed     Increase activity slowly  As directed        Medication List    STOP taking these medications       LORazepam 1 MG tablet  Commonly known as:  ATIVAN      TAKE these medications       amiodarone 200 MG tablet  Commonly known as:  PACERONE  Take 1 tablet (200 mg total) by mouth daily.     dabigatran 150 MG Caps capsule  Commonly known as:  PRADAXA  Take 150 mg by mouth every 12 (twelve) hours.     diltiazem 240 MG 24 hr capsule  Commonly known as:  TIAZAC  Take 240 mg by mouth daily.     folic acid 1 MG tablet  Commonly known as:  FOLVITE  Take 1 tablet (1 mg total) by mouth daily.     furosemide 20 MG tablet  Commonly known as:  LASIX  Take 1 tablet (20 mg total) by mouth daily.     HYDROcodone-acetaminophen 7.5-325 MG per tablet  Commonly known as:  NORCO  Take 1 tablet by mouth every 6 (six) hours as needed for pain.     lisinopril 5 MG tablet  Commonly known as:  PRINIVIL,ZESTRIL  Take 1 tablet (5 mg total) by mouth daily.     loratadine 10 MG tablet  Commonly known as:  CLARITIN  Take 10 mg by mouth daily.     multivitamin with minerals Tabs tablet  Take 1 tablet by mouth daily.     nicotine 21 mg/24hr patch  Commonly known as:  NICODERM CQ - dosed in mg/24 hours  Place 1 patch onto the skin daily.     sertraline 100 MG tablet  Commonly known as:  ZOLOFT  Take 150 mg by mouth daily.     thiamine 100 MG tablet  Take 1 tablet (100 mg total) by mouth daily.       Allergies  Allergen Reactions  . Other Anaphylaxis    Any "cillins"  . Penicillins Anaphylaxis   Follow-up Information   Schedule an appointment as soon as possible for a visit in 2 weeks to follow up. (With your regular doctor)        The results of significant diagnostics from this hospitalization (including imaging, microbiology, ancillary and laboratory) are listed below for reference.    Significant Diagnostic Studies: Dg Chest 2 View  10/21/2012   *RADIOLOGY REPORT*   Clinical Data: Follow-up infiltrates, cough, pneumonia  CHEST - 2 VIEW  Comparison: 10/19/2012  Findings: Heart size and vascular pattern normal.  Hyperinflation consistent with COPD.  Small bilateral pleural effusions.  Diffuse interstitial infiltrates have nearly completely resolved.  Nodular density over the left lung base appears to represent a probable nipple shadow.  IMPRESSION: Significant improvement in bilateral interstitial infiltrates consistent with resolving pulmonary edema superimposed on COPD. Probable left base nipple shadow.   Original Report Authenticated By: Esperanza Heir, M.D.   Ct Chest W Contrast  10/21/2012   *  RADIOLOGY REPORT*  Clinical Data: Evaluate infiltrates right upper lobe density  CT CHEST WITH CONTRAST  Technique:  Multidetector CT imaging of the chest was performed following the standard protocol during bolus administration of intravenous contrast.  Contrast: 80mL OMNIPAQUE IOHEXOL 300 MG/ML  SOLN  Comparison: Chest x-ray obtained earlier today at 07:54 a.m.  Findings:  Mediastinum: Prominence, borderline enlarged by CT criteria mediastinal lymph nodes.  Index right paratracheal node measures 12.5 mm in short axis.  The prevascular node measures 9 mm in short axis.  Hilar lymphoid tissue is mildly prominent.  Heart/Vascular: Conventional three-vessel arch anatomy. Atherosclerotic calcifications are noted at the origin of the great vessels without significant appearing stenosis.  No aortic aneurysmal dilatation or dissection.  The heart is within normal limits for size.  No pericardial effusion.  Atherosclerotic calcifications noted throughout the coronary arteries including the left main coronary artery.  There is calcification of the mitral valve annulus and aortic valve leaflets.  No large central pulmonary embolus.  Lungs/Pleura: Moderate bilateral layering pleural effusions slightly larger on the right than the left.  Interlobular septal thickening with patchy areas of  ground-glass attenuation opacity in the bilateral lungs is most suggestive of mild interstitial and alveolar pulmonary edema.  There is a background of centrilobular emphysema and diffuse mild bronchial wall thickening suggesting underlying COPD.  Linear atelectasis versus scarring noted within the right middle lobe.  There is compressive atelectasis in the bilateral lower lobes secondary to the presence of the pleural effusions. Several scattered small pulmonary nodules are noted including a 2 mm nodule in the periphery of the right upper lobe (image 21, series 5) and a small cluster of nodules in the posterior aspect of the superior segment of the right lower lobe with a maximal size of 3 mm (image 33, series 5).  Upper Abdomen: Surgical changes of prior cholecystectomy.  Mild prominence of the extrahepatic biliary duct without significant dilatation.  Scattered atherosclerotic vascular calcifications. Otherwise, limited imaging of the upper abdomen is unremarkable.  Bones: No acute fracture or aggressive appearing lytic or blastic osseous lesion.  IMPRESSION:  1.  CT findings are most consistent with mild interstitial and alveolar edema.  2.  Bilateral moderate pleural effusions slightly larger on the right than the left with associated lower lobe atelectasis.  3.  Centrilobular emphysema  4.  Pulmonary hyperexpansion and diffuse bronchial wall thickening suggests underlying chronic bronchitis/COPD  5.  Atherosclerosis including coronary artery disease.  Calcified coronary artery plaque is noted in the left main coronary artery.  6.  Scattered 2 - 3 mm pulmonary nodules likely reflect an underlying infectious/inflammatory or granulomatous process.  7.  Nonspecific borderline mediastinal adenopathy may be reactive or related to chronic recurrent congestive heart failure.  Consider repeat chest CT in 3 - 6 months to assess for the stability of the small pulmonary nodules and borderline mediastinal adenopathy.    Original Report Authenticated By: Malachy Moan, M.D.   Dg Chest Port 1 View  10/19/2012   *RADIOLOGY REPORT*  Clinical Data: Cough and congestion.   Smoker for 60 years.  PORTABLE CHEST - 1 VIEW  Comparison: None.  Findings: Diffuse increased lung markings.  Some of findings may be chronic in origin but difficult to assess without comparison. There may be superimposed pulmonary edema or possibly infectious infiltrate in the proper clinical setting.  Slight patchiness of lung changes limit detection of underlying mass.  Stability and/or clearing will need to be confirmed on follow-up.  Heart size within  normal limits.  Calcified slightly tortuous aorta.  No gross pneumothorax.  Blunting right costophrenic angle may represent presence of small effusion.  IMPRESSION: Diffuse increased lung markings may represent pulmonary edema superimposed on chronic changes.  Follow up recommended as noted above.   Original Report Authenticated By: Lacy Duverney, M.D.   Ct Outside Films Chest  10/22/2012   This examination belongs to an outside facility and is stored  here for comparison purposes only.  Contact the originating outside  institution for any associated report or interpretation.  Dg Outside Films Chest  10/22/2012   This examination belongs to an outside facility and is stored  here for comparison purposes only.  Contact the originating outside  institution for any associated report or interpretation.  Dg Outside Films Chest  10/22/2012   This examination belongs to an outside facility and is stored  here for comparison purposes only.  Contact the originating outside  institution for any associated report or interpretation.  Dg Outside Films Chest  10/22/2012   This examination belongs to an outside facility and is stored  here for comparison purposes only.  Contact the originating outside  institution for any associated report or interpretation.  Ct Outside Films Head/face  10/22/2012   This  examination belongs to an outside facility and is stored  here for comparison purposes only.  Contact the originating outside  institution for any associated report or interpretation.   Microbiology: Recent Results (from the past 240 hour(s))  MRSA PCR SCREENING     Status: None   Collection Time    10/19/12  5:53 PM      Result Value Range Status   MRSA by PCR NEGATIVE  NEGATIVE Final   Comment:            The GeneXpert MRSA Assay (FDA     approved for NASAL specimens     only), is one component of a     comprehensive MRSA colonization     surveillance program. It is not     intended to diagnose MRSA     infection nor to guide or     monitor treatment for     MRSA infections.     Labs: Basic Metabolic Panel:  Recent Labs Lab 10/19/12 0400 10/20/12 0322 10/20/12 1755 10/21/12 0131 10/22/12 0350  NA 135 131* 135 135 134*  K 3.8 2.8* 3.9 4.3 4.1  CL 100 90* 93* 94* 94*  CO2 28 33* 30 32 34*  GLUCOSE 158* 240* 270* 184* 130*  BUN 11 11 17 17 19   CREATININE 0.43* 0.42* 0.52 0.57 0.56  CALCIUM 8.1* 8.2* 8.2* 8.1* 8.4  MG  --  1.7  --  2.1  --   PHOS  --  2.9  --  1.7*  --    Liver Function Tests: No results found for this basename: AST, ALT, ALKPHOS, BILITOT, PROT, ALBUMIN,  in the last 168 hours No results found for this basename: LIPASE, AMYLASE,  in the last 168 hours No results found for this basename: AMMONIA,  in the last 168 hours CBC:  Recent Labs Lab 10/18/12 2217 10/19/12 0400 10/20/12 0322 10/21/12 0131 10/22/12 0350  WBC 13.5* 12.0* 13.5* 15.7* 12.2*  NEUTROABS  --  11.2* 12.4* 14.5*  --   HGB 10.4* 10.2* 11.7* 12.6 11.7*  HCT 30.1* 29.3* 34.2* 37.1 35.6*  MCV 88.5 89.3 88.8 89.6 90.6  PLT 294 299 327 390 390   Cardiac Enzymes:  Recent Labs Lab 10/19/12  1740 10/19/12 2350 10/20/12 0731  TROPONINI 0.86* 1.57* 1.35*   BNP: BNP (last 3 results)  Recent Labs  10/19/12 1741 10/20/12 0322  PROBNP 11944.0* 23489.0*   CBG: No results  found for this basename: GLUCAP,  in the last 168 hours     Signed:  Chaya Jan  [facility] Pager: [phone] 10/23/2012, 4:10 PM

## 2012-10-23 NOTE — Progress Notes (Addendum)
The patient voices no cardiac complaints. Breathing has improved.   Maintaining normal sinus rhythm.  Amiodarone dose should be decreased to 200 mg daily at discharge.  Please call if we may be of further assistance

## 2012-10-24 NOTE — Progress Notes (Signed)
[facility]  After discharge yesterday (10/23/12) with orders for RN and PT to be seen at her daughter's home in [place], [place] before returning to her home in [place].  [facility] start of care RN called to set up first visit and patient informed her that she would not be staying at her daughter's home and is heading straight back to [facility]  Home Health Services have been closed out.    Lanae Crumbly 10/24/2012, 10:09 AM

## 2014-01-16 IMAGING — CT CT CHEST W/ CM
2 of 4 series · 14 of 36 positions shown, 17 images · IV contrast (OMNIPAQUE)
Comparison: Chest x-ray obtained earlier today at [DATE] a.m.

CLINICAL DATA: Evaluate infiltrates right upper lobe density

CT CHEST WITH CONTRAST
TECHNIQUE: Multidetector CT imaging of the chest was performed
following the standard protocol during bolus administration of
intravenous contrast.
Contrast: 80mL OMNIPAQUE IOHEXOL 300 MG/ML  SOLN

[Series 2: chest with st · axial · 0.65mm/px · z∈[-326,-26]mm · 11 of 71 slices shown, 14 images]
[im 6/71  mediastinal]
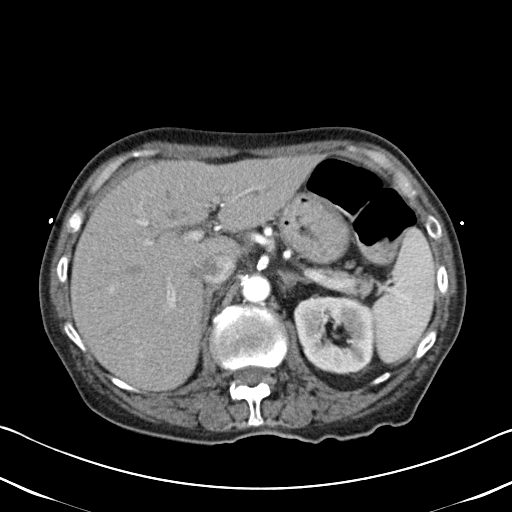
[im 6/71  lung]
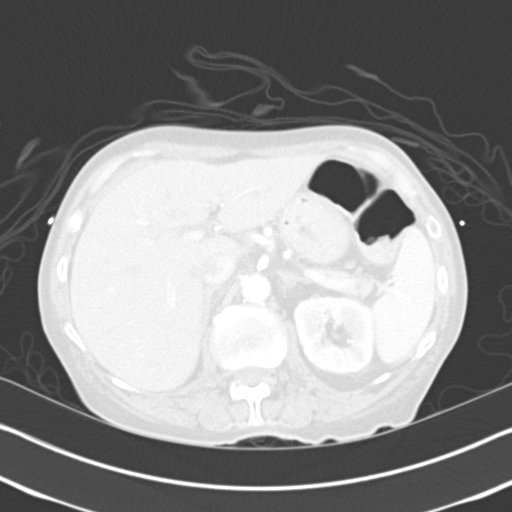
[im 11/71  lung]
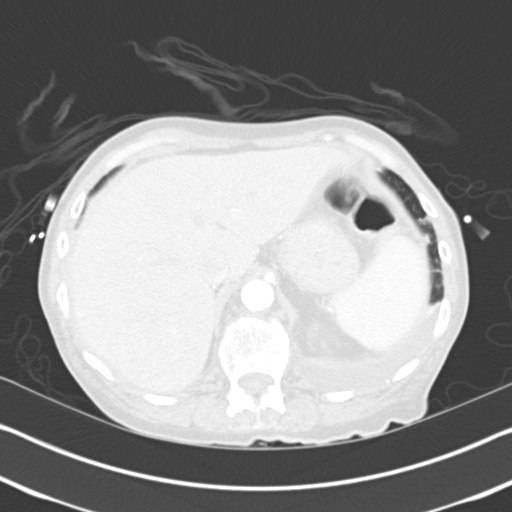
[im 16/71  lung]
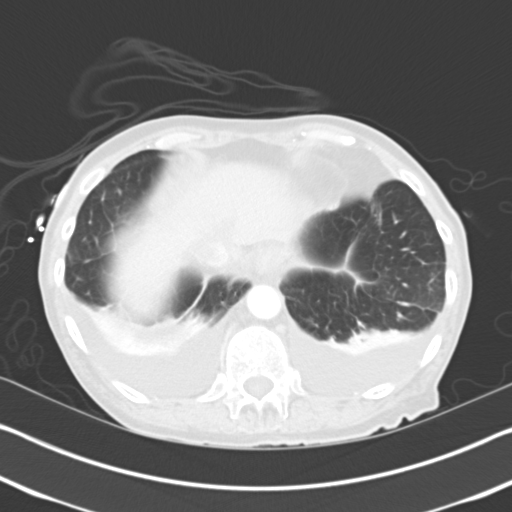
[im 26/71  lung]
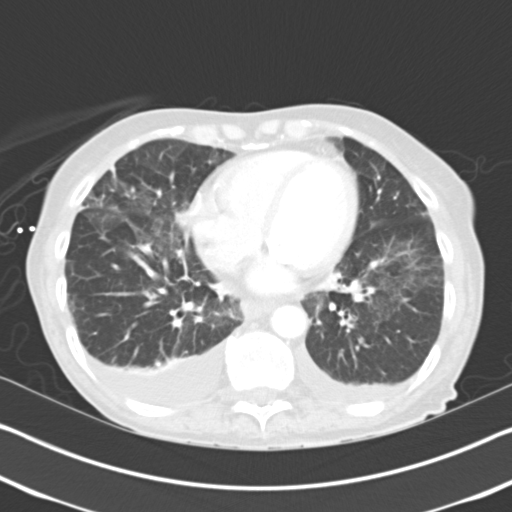
[im 31/71  mediastinal]
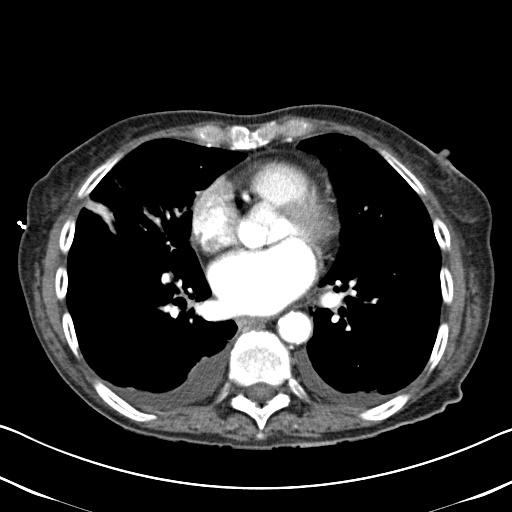
[im 31/71  lung]
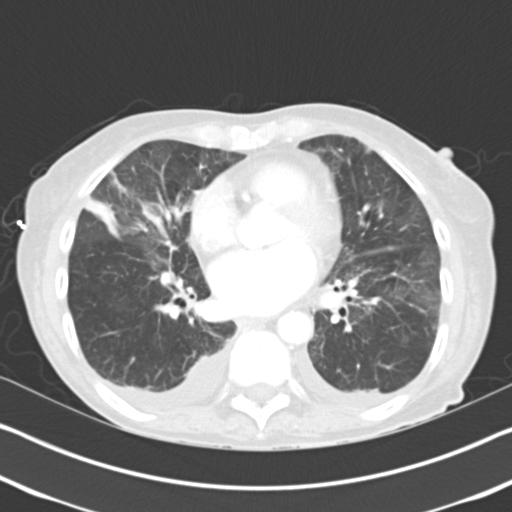
[im 36/71  lung]
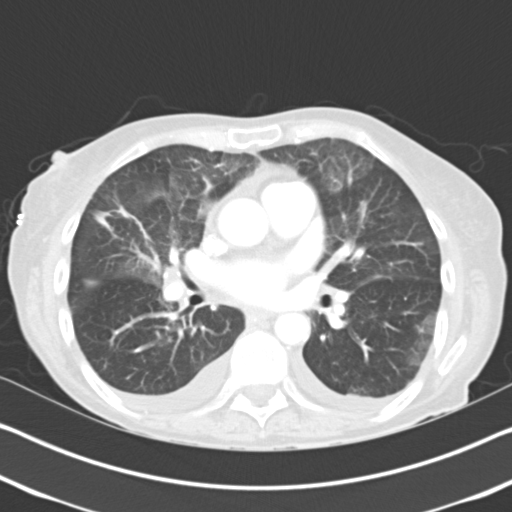
[im 41/71  lung]
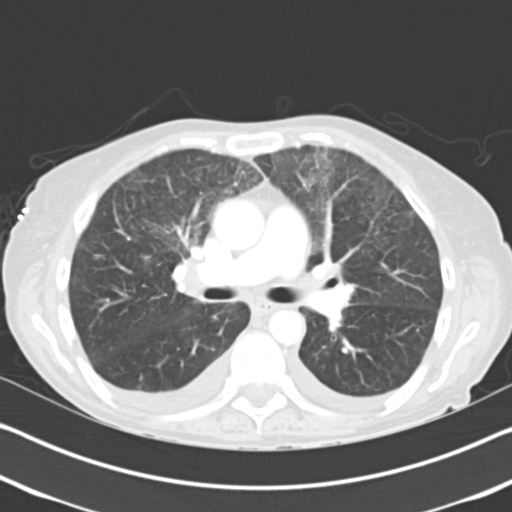
[im 46/71  lung]
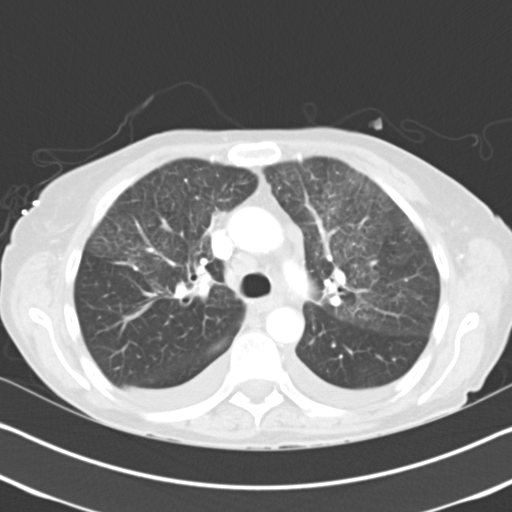
[im 56/71  mediastinal]
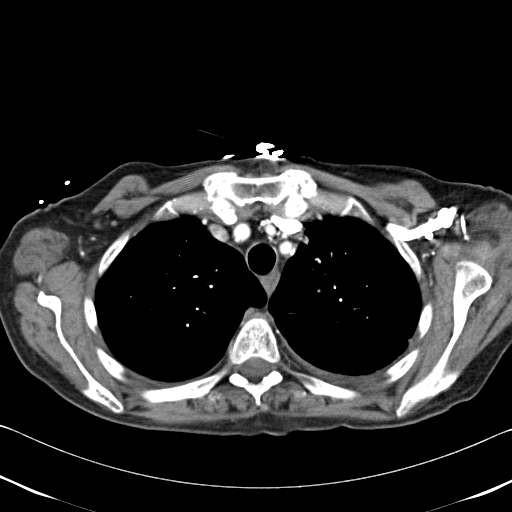
[im 56/71  lung]
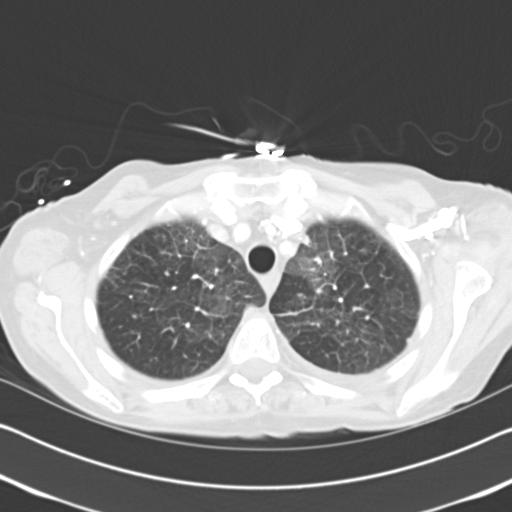
[im 61/71  lung]
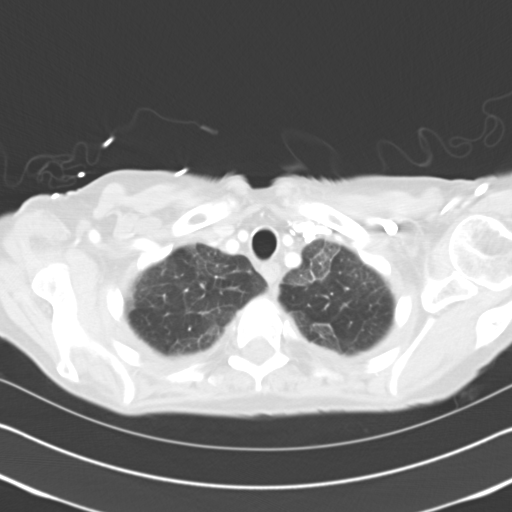
[im 66/71  lung]
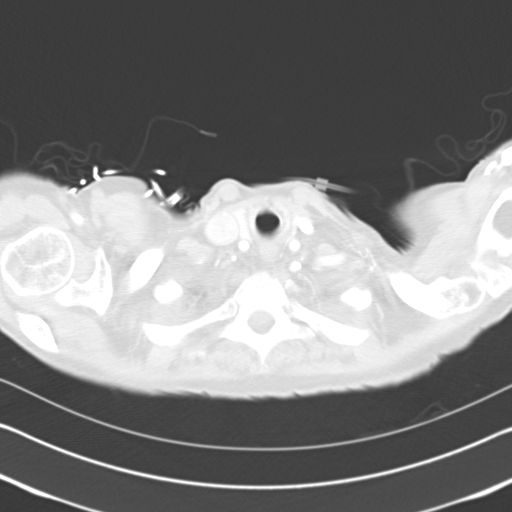

[Series 602: <mpr thick range> · coronal · 0.69mm/px · 3 of 78 slices shown]
[im 16/78  lung]
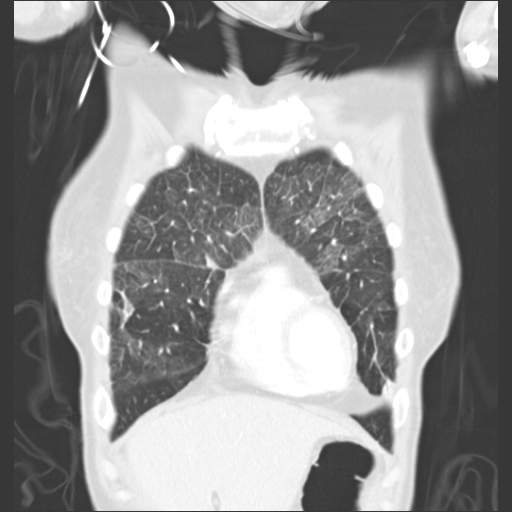
[im 31/78  lung]
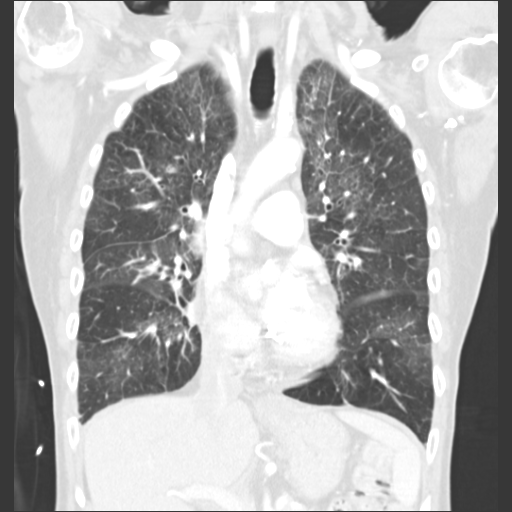
[im 47/78  lung]
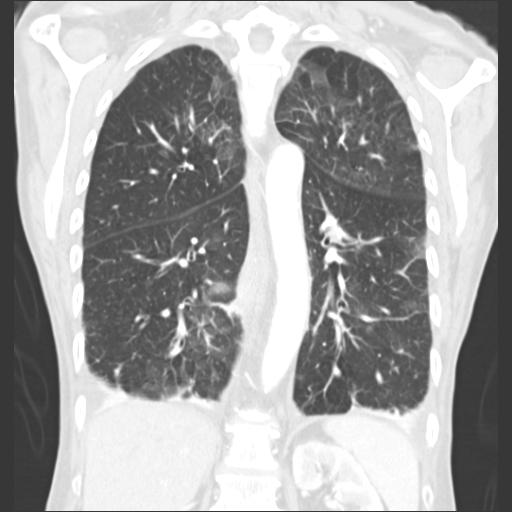

[14 of 36 positions shown; findings below may reference images not displayed]

FINDINGS: Mediastinum: Prominence, borderline enlarged by CT criteria
mediastinal lymph nodes.  Index right paratracheal node measures
12.5 mm in short axis.  The prevascular node measures 9 mm in short
axis.  Hilar lymphoid tissue is mildly prominent.

Heart/Vascular: Conventional three-vessel arch anatomy.
Atherosclerotic calcifications are noted at the origin of the great
vessels without significant appearing stenosis.  No aortic
aneurysmal dilatation or dissection.  The heart is within normal
limits for size.  No pericardial effusion.  Atherosclerotic
calcifications noted throughout the coronary arteries including the
left main coronary artery.  There is calcification of the mitral
valve annulus and aortic valve leaflets.  No large central
pulmonary embolus.

Lungs/Pleura: Moderate bilateral layering pleural effusions
slightly larger on the right than the left.  Interlobular septal
thickening with patchy areas of ground-glass attenuation opacity in
the bilateral lungs is most suggestive of mild interstitial and
alveolar pulmonary edema.  There is a background of centrilobular
emphysema and diffuse mild bronchial wall thickening suggesting
underlying COPD.  Linear atelectasis versus scarring noted within
the right middle lobe.  There is compressive atelectasis in the
bilateral lower lobes secondary to the presence of the pleural
effusions. Several scattered small pulmonary nodules are noted
including a 2 mm nodule in the periphery of the right upper lobe
(image 21, series 5) and a small cluster of nodules in the
posterior aspect of the superior segment of the right lower lobe
with a maximal size of 3 mm (image 33, series 5).

Upper Abdomen: Surgical changes of prior cholecystectomy.  Mild
prominence of the extrahepatic biliary duct without significant
dilatation.  Scattered atherosclerotic vascular calcifications.
Otherwise, limited imaging of the upper abdomen is unremarkable.

Bones: No acute fracture or aggressive appearing lytic or blastic
osseous lesion.
IMPRESSION: 1.  CT findings are most consistent with mild interstitial and
alveolar edema.

2.  Bilateral moderate pleural effusions slightly larger on the
right than the left with associated lower lobe atelectasis.

3.  Centrilobular emphysema

4.  Pulmonary hyperexpansion and diffuse bronchial wall thickening
suggests underlying chronic bronchitis/COPD

5.  Atherosclerosis including coronary artery disease.  Calcified
coronary artery plaque is noted in the left main coronary artery.

6.  Scattered 2 - 3 mm pulmonary nodules likely reflect an
underlying infectious/inflammatory or granulomatous process.

7.  Nonspecific borderline mediastinal adenopathy may be reactive
or related to chronic recurrent congestive heart failure.

Consider repeat chest CT in 3 - 6 months to assess for the
stability of the small pulmonary nodules and borderline mediastinal
adenopathy.

## 2015-07-02 ENCOUNTER — Ambulatory Visit: Payer: Medicare Other | Attending: Orthopaedic Surgery | Admitting: Orthopaedic Surgery

## 2015-07-02 ENCOUNTER — Encounter (HOSPITAL_BASED_OUTPATIENT_CLINIC_OR_DEPARTMENT_OTHER): Payer: Self-pay | Admitting: Orthopaedic Surgery

## 2015-07-02 VITALS — BP 150/74 | HR 67 | Temp 97.2°F | Ht 67.0 in | Wt 100.0 lb

## 2015-07-02 DIAGNOSIS — M24542 Contracture, left hand: Secondary | ICD-10-CM | POA: Insufficient documentation

## 2015-07-02 DIAGNOSIS — Z79899 Other long term (current) drug therapy: Secondary | ICD-10-CM | POA: Insufficient documentation

## 2015-07-02 DIAGNOSIS — J449 Chronic obstructive pulmonary disease, unspecified: Secondary | ICD-10-CM | POA: Insufficient documentation

## 2015-07-02 DIAGNOSIS — Z8701 Personal history of pneumonia (recurrent): Secondary | ICD-10-CM | POA: Insufficient documentation

## 2015-07-02 DIAGNOSIS — M24541 Contracture, right hand: Secondary | ICD-10-CM | POA: Insufficient documentation

## 2015-07-02 DIAGNOSIS — K219 Gastro-esophageal reflux disease without esophagitis: Secondary | ICD-10-CM | POA: Insufficient documentation

## 2015-07-02 DIAGNOSIS — I10 Essential (primary) hypertension: Secondary | ICD-10-CM | POA: Insufficient documentation

## 2015-07-02 DIAGNOSIS — M2450 Contracture, unspecified joint: Secondary | ICD-10-CM

## 2015-07-02 DIAGNOSIS — G629 Polyneuropathy, unspecified: Secondary | ICD-10-CM | POA: Insufficient documentation

## 2015-07-02 DIAGNOSIS — E039 Hypothyroidism, unspecified: Secondary | ICD-10-CM | POA: Insufficient documentation

## 2015-07-02 DIAGNOSIS — Z7984 Long term (current) use of oral hypoglycemic drugs: Secondary | ICD-10-CM | POA: Insufficient documentation

## 2015-07-02 DIAGNOSIS — E119 Type 2 diabetes mellitus without complications: Secondary | ICD-10-CM | POA: Insufficient documentation

## 2015-07-10 ENCOUNTER — Other Ambulatory Visit (HOSPITAL_BASED_OUTPATIENT_CLINIC_OR_DEPARTMENT_OTHER): Payer: Self-pay | Admitting: Physician Assistant

## 2015-07-10 NOTE — H&P (Signed)
[facility]                              [facility] OF [facility]                                [place], [place] [id number]                                PAT[facility]: Jenna Simmons, [facility][id number]:[id number]  DATE OF SERVICE:07/02/2015  DATE OF BIRTH: 07/07/1945    HISTORY AND PHYSICAL    HISTORY OF PRESENT ILLNESS:  Ms. Jenna Simmons comes in today for bilateral hand contractures.  She states that over the past year her fingers have been contracting up in her hand.  We examined her hand today.  There was no evidence of Dupuytren contractures or triggering.  Our initial impression is that this is probably due to some type of a neurological condition rather than something going on actually with the hands.  She has various degrees of flexion contractures involving the MP and PIP joints on the various hands.  The right hand has an index finger MP joint flexed to about 38 degrees with a PIP joint of 19 degrees.  Her right middle finger has an MP joint flexion of 82 and PIP joint to 90 degrees, ring finger MP joint, 85 degrees flexion and PIP joint of 73 degrees and a small finger 100 degrees flexed and PIP joint of 30 degrees.  The left side MP joint is okay.  The PIP joint is okay on the left side.  The middle finger has an MP joint flexion of about 80 degrees and a PIP joint flexed down of about 128 degrees.  The ring finger MP joint is 50 degrees with a PIP joint of about 110 degrees and small finger PIP joint down to about 94 degrees.  There is very minimal movement in these as far as extension and extending them passively.    PAST MEDICAL HISTORY:  The patient lists acid reflux as a medical problem.  Given her medical list, she also suffers from hypertension, peripheral neuropathies, hypothyroidism, type 2 diabetes, history of pneumonia, COPD, reflux.    PAST SURGICAL HISTORY:  The patient had a cholecystectomy in the past.    MEDICATIONS:  The patient's medication list  includes:  1.  Breo.  3.  Inhaler.  4.  Cartia XT.  5.  Crestor.  6.  Folic acid.  7.  Furosemide.  8.  Neurontin.  9.  Ipratropium bromide inhaler.  10.  Klor-Con.  11.  Leflunomide.  12.  Levothyroxine.  13.  Lisinopril.  14.  Loratadine.  15.  Megestrol.  16.  Meloxicam.  17.  Memantine.  18.  Metformin.  19.  Multivitamin.    ALLERGIES:  The patient is allergic to penicillin.    SOCIAL HISTORY:  The patient occasionally consumes alcoholic beverages.  She denies the use of tobacco products.    REVIEW OF SYSTEMS:  Noncontributory to her exam and may be found on outpatient history form.    PHYSICAL EXAMINATION:  The patient was alert and oriented x3.  Vital signs included a blood pressure of 150/74, pulse 67, respirations 14, temperature 36.2 degrees Celsius.  Skin was warm, dry and intact.  No signs or symptoms  of infection.  Head, ears, eyes, nose and throat were clear.  Capillary refill was brisk.  Distal pulses present and equal bilaterally.  Breathing was nonlabored.  She was in no acute distress.    IMPRESSION:  Bilateral hand contractures.    PLAN:  At this point, we believe that her symptoms are neurologically related.  We are unsure if she has had some type of an undiagnosed stroke, or some other brain injury or problem.  We are recommending that she be referred to a neurologist for a workup as we do not see any of the common causes of contractures, such as trigger fingers or Dupuytren contractures present.  We would be happy to see her back after her neurologic consult if there is something felt could be done for her.  We will go ahead and schedule her to see [place] back in about 3 months.  Hopefully, by that time she will have had a neurological consult and we can see if there is anything further that can be done.    If you have any further questions, please feel free to contact us at [phone].      Bea Graff, PA-C  [place] Department of Orthopaedics    Rivka Barbara, MD  Assistant Professor  [facility] Department of  Orthopaedics    [id number]; D: 07/10/2015 11:38:02; T: 07/10/2015 12:35:28           Attending Physician Statement: I personally evaluated Jenna Simmons and participated in / performed the significant components of this encounter.    IMPRESSION and PLAN: Bilateral hand contractures.  There is probably an underlying neurologic pathology. She would be evaluated by a neurologist.  She may benefit from surgical intervention in the form of STP transfer or a digit widget application. She will get hand therapy evaluation for possible splinting.  Follow-up in three months.      Jenna Simmons expressed understanding of this and was in agreement with the the above.  All questions and concerns were answered prior to discharge.    Rivka Barbara, MD  Assistant Professor  Hand, Elbow, and Shoulder Surgery  Orthopedic Sports Medicine

## 2015-07-15 ENCOUNTER — Other Ambulatory Visit (HOSPITAL_BASED_OUTPATIENT_CLINIC_OR_DEPARTMENT_OTHER): Payer: Self-pay | Admitting: Physician Assistant

## 2015-07-15 DIAGNOSIS — M24549 Contracture, unspecified hand: Secondary | ICD-10-CM

## 2015-10-08 ENCOUNTER — Encounter (HOSPITAL_BASED_OUTPATIENT_CLINIC_OR_DEPARTMENT_OTHER): Payer: Medicare Other | Admitting: Orthopaedic Surgery

## 2015-10-20 ENCOUNTER — Ambulatory Visit (INDEPENDENT_AMBULATORY_CARE_PROVIDER_SITE_OTHER): Payer: Self-pay | Admitting: Physician Assistant

## 2015-10-20 NOTE — Telephone Encounter (Addendum)
-----   Message from [facility] sent at 10/20/2015 10:32 AM EDT -----  Nicki ReaperSraj pt     Pt's husband called very upset he lost the info of the outside neurologist   They don't have a phone so he is going to call back on Thursday to get the info   The only thing I know is that the appt is on the 19th    Can you respond and let me know so when he calls back in I can give it to him   Thanks    PER MICHELLE AT DR VAUGHT'S OFFICE, PT HAS APPT 09.19.17 AT 3:15PM...Marland Kitchen..Marland Kitchen

## 2015-10-29 ENCOUNTER — Encounter (HOSPITAL_BASED_OUTPATIENT_CLINIC_OR_DEPARTMENT_OTHER): Payer: Medicare Other | Admitting: Orthopaedic Surgery

## 2017-05-08 DEATH — deceased
